# Patient Record
Sex: Male | Born: 1985 | Race: White | Hispanic: No | Smoking: Former smoker
Health system: Southern US, Community
[De-identification: ages and names within clinical notes are randomized; demographics above are authoritative.]

## PROBLEM LIST (undated history)

## (undated) DIAGNOSIS — F4310 Post-traumatic stress disorder, unspecified: Secondary | ICD-10-CM

## (undated) HISTORY — PX: KNEE SURGERY: SHX244

## (undated) MED ORDER — SERTRALINE 100 MG TAB
100 mg | ORAL_TABLET | Freq: Two times a day (BID) | ORAL | Status: DC
Start: ? — End: 2014-09-30

## (undated) MED ORDER — QUETIAPINE 100 MG TAB
100 mg | ORAL_TABLET | Freq: Every evening | ORAL | Status: DC | PRN
Start: ? — End: 2014-09-30

## (undated) MED ORDER — PRAZOSIN 5 MG CAP
5 mg | ORAL_CAPSULE | Freq: Every evening | ORAL | Status: DC
Start: ? — End: 2014-09-30

## (undated) MED ORDER — LORAZEPAM 2 MG TAB
2 mg | ORAL_TABLET | Freq: Three times a day (TID) | ORAL | Status: DC | PRN
Start: ? — End: 2014-09-30

## (undated) MED ORDER — IBUPROFEN 600 MG TAB
600 mg | ORAL_TABLET | Freq: Four times a day (QID) | ORAL | Status: DC | PRN
Start: ? — End: 2014-09-30

---
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
## 2007-03-06 IMAGING — CR DG KNEE COMPLETE 4+V*L*
4 series · 4 of 4 positions shown · non-contrast
Comparison: None.

CLINICAL DATA: Twisted knee ? pain and swelling. 
 LEFT KNEE ? 4 VIEW:

[view not recorded (1 of 4)]
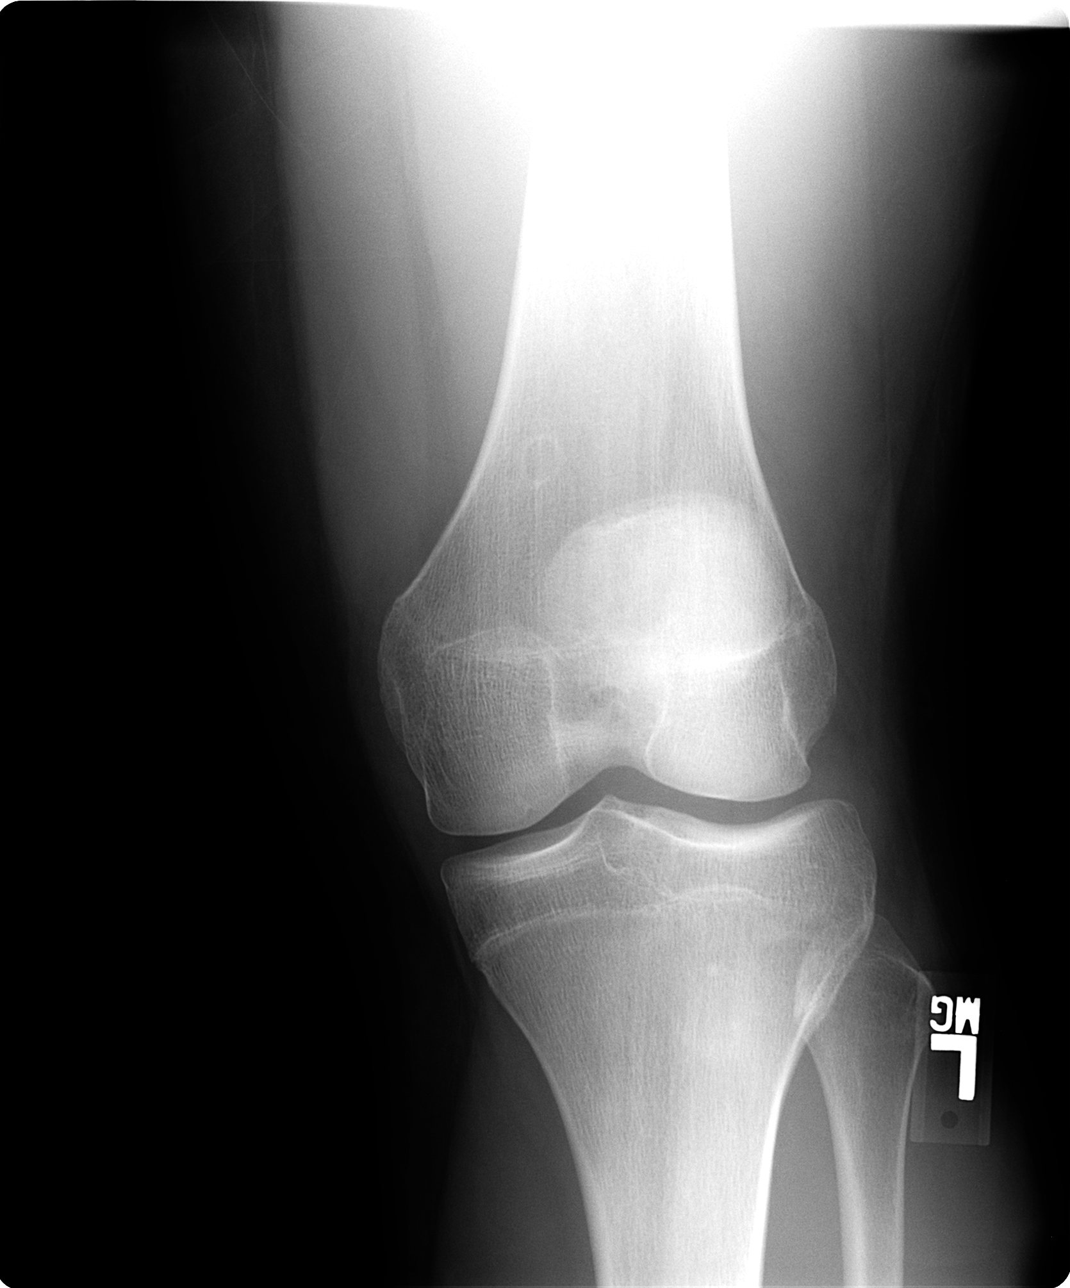

[view not recorded (2 of 4)]
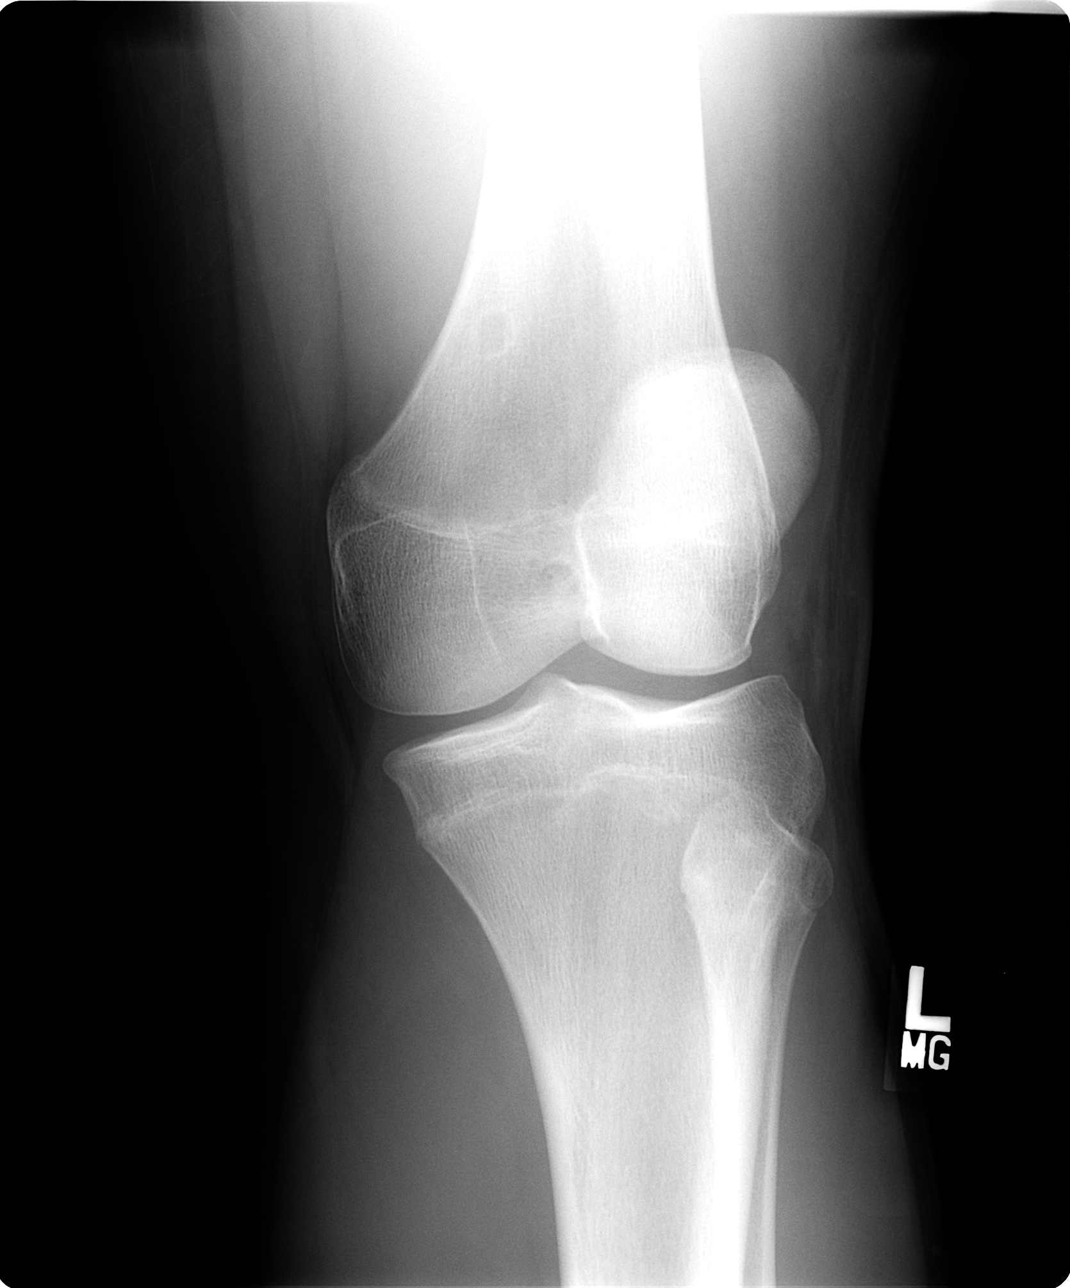

[view not recorded (3 of 4)]
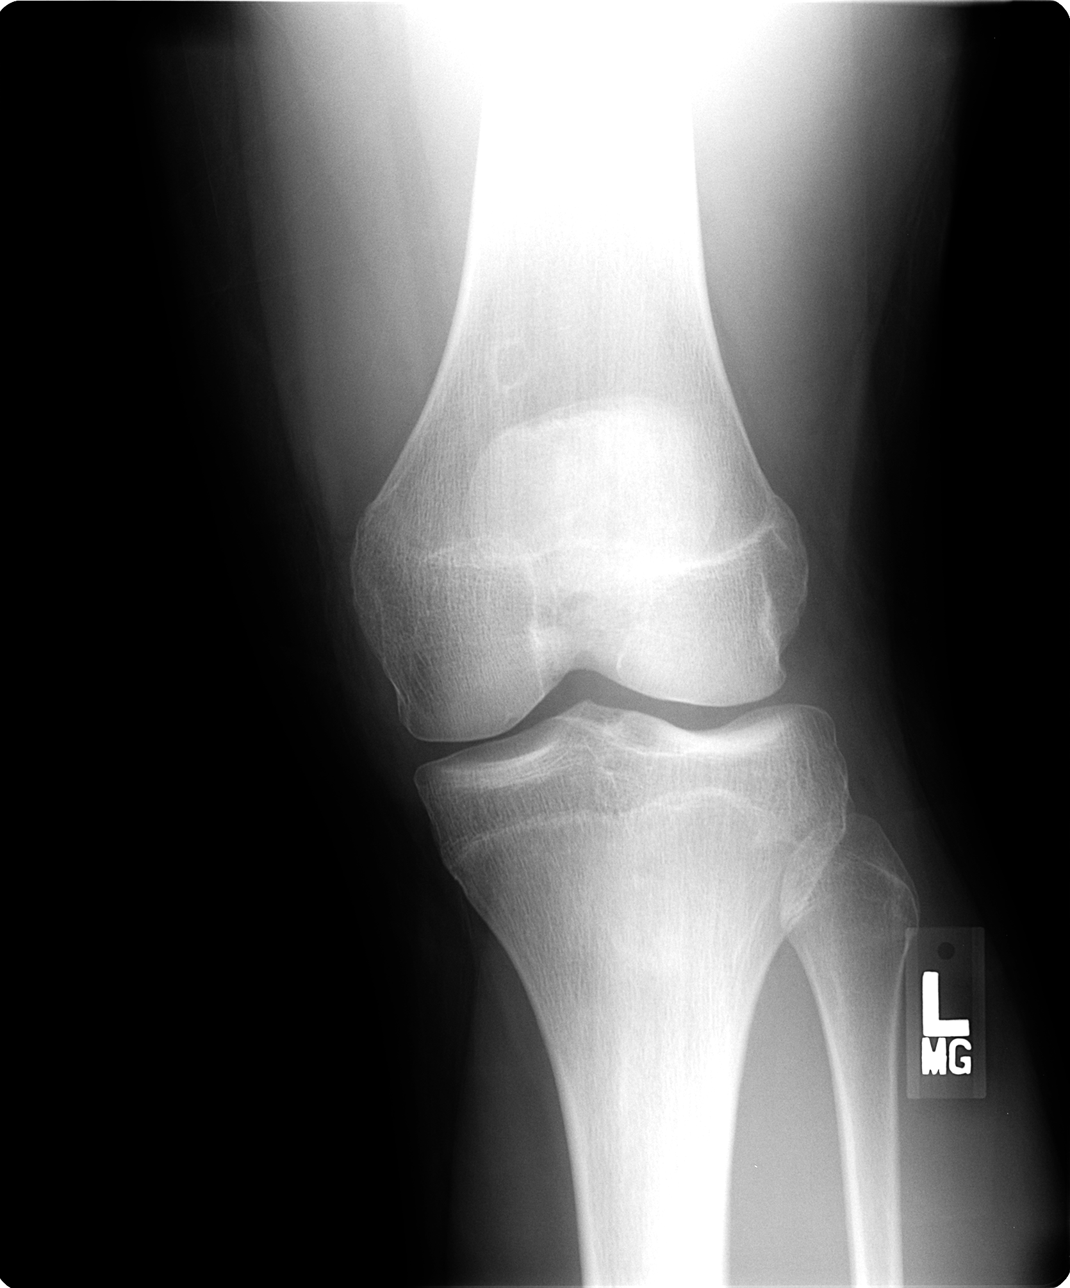

[view not recorded (4 of 4)]
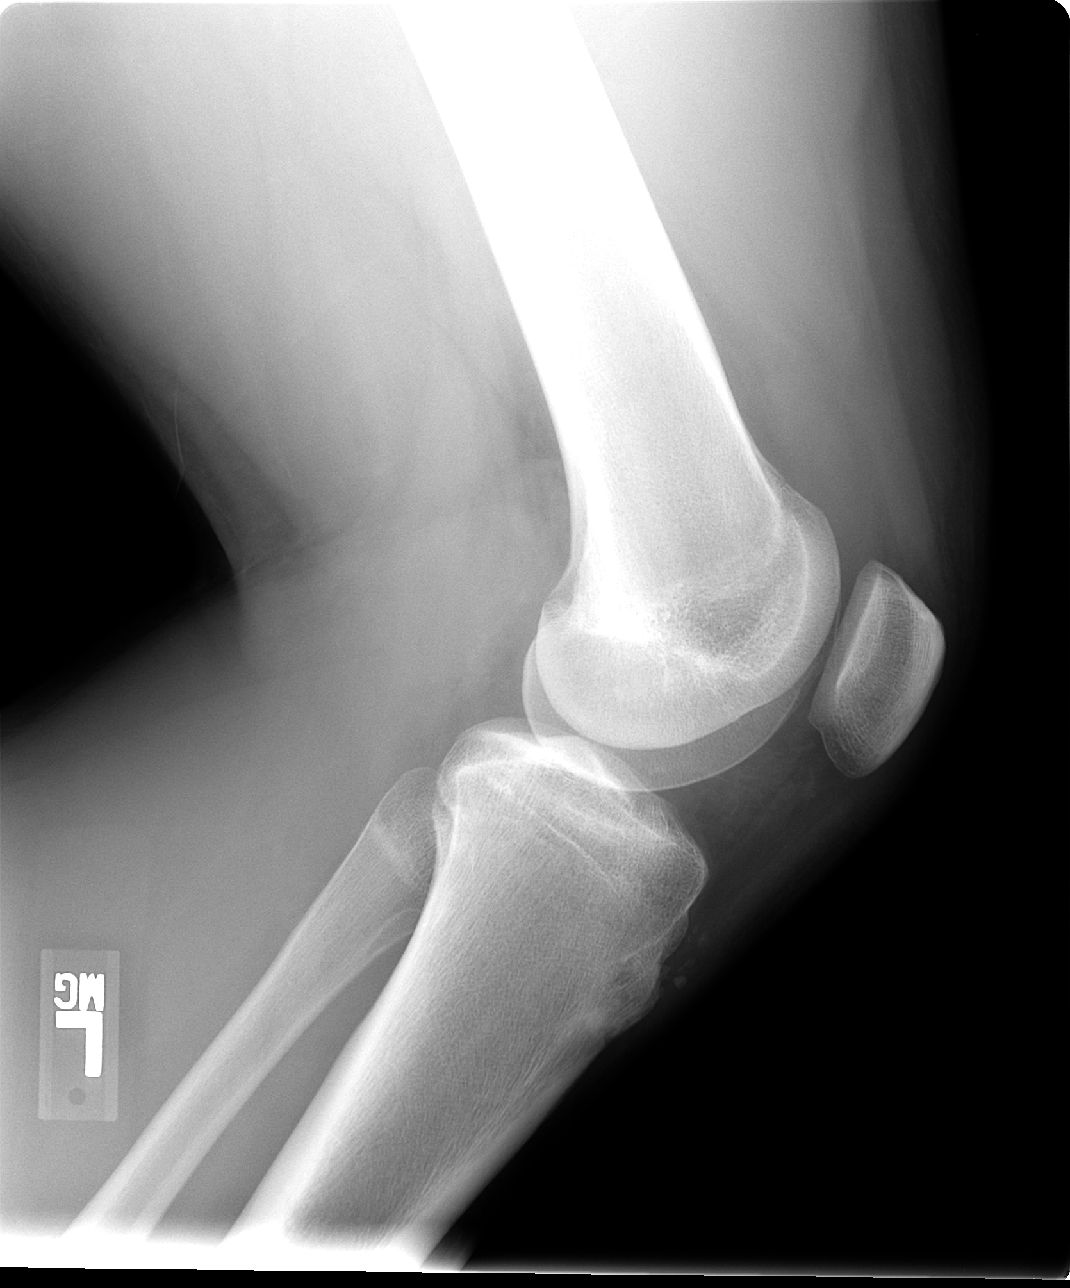

[4 of 4 positions shown; findings below may reference images not displayed]

FINDINGS: No acute abnormality of the bony structures.  There is a joint effusion noted on the lateral view.  There is a benign lesion of the medial aspect of the distal femur, probably representing a benign fibrous cortical defect.  On the lateral view one also sees some fragmentation of the upper tibial tuberosity, which is a variant of normal.
IMPRESSION: 1.  Probable benign fibrous cortical defect of the distal femur. 
 2.  Joint effusion. 
 3.  No acute fracture or dislocation.

## 2009-04-25 ENCOUNTER — Emergency Department (HOSPITAL_COMMUNITY): Admission: EM | Admit: 2009-04-25 | Discharge: 2009-04-25 | Payer: Self-pay | Admitting: Family Medicine

## 2009-06-13 ENCOUNTER — Emergency Department (HOSPITAL_COMMUNITY): Admission: EM | Admit: 2009-06-13 | Discharge: 2009-06-13 | Payer: Self-pay | Admitting: Emergency Medicine

## 2009-07-12 ENCOUNTER — Emergency Department (HOSPITAL_COMMUNITY): Admission: EM | Admit: 2009-07-12 | Discharge: 2009-07-12 | Payer: Self-pay | Admitting: Emergency Medicine

## 2009-08-23 ENCOUNTER — Emergency Department (HOSPITAL_COMMUNITY): Admission: EM | Admit: 2009-08-23 | Discharge: 2009-08-23 | Payer: Self-pay | Admitting: Emergency Medicine

## 2010-02-14 ENCOUNTER — Emergency Department (HOSPITAL_COMMUNITY): Admission: EM | Admit: 2010-02-14 | Discharge: 2010-02-14 | Payer: Self-pay | Admitting: Emergency Medicine

## 2010-06-19 ENCOUNTER — Emergency Department (HOSPITAL_COMMUNITY): Admission: EM | Admit: 2010-06-19 | Discharge: 2010-06-20 | Payer: Self-pay | Admitting: Emergency Medicine

## 2010-06-21 ENCOUNTER — Emergency Department (HOSPITAL_COMMUNITY): Admission: EM | Admit: 2010-06-21 | Discharge: 2010-06-21 | Payer: Self-pay | Admitting: Emergency Medicine

## 2010-10-26 LAB — RAPID URINE DRUG SCREEN, HOSP PERFORMED
Amphetamines: NOT DETECTED
Barbiturates: NOT DETECTED
Benzodiazepines: NOT DETECTED
Cocaine: NOT DETECTED
Opiates: NOT DETECTED
Tetrahydrocannabinol: NOT DETECTED

## 2010-10-26 LAB — DIFFERENTIAL
Basophils Absolute: 0.2 10*3/uL — ABNORMAL HIGH (ref 0.0–0.1)
Eosinophils Absolute: 0.1 10*3/uL (ref 0.0–0.7)
Neutro Abs: 6.3 10*3/uL (ref 1.7–7.7)
Neutrophils Relative %: 67 % (ref 43–77)

## 2010-10-26 LAB — CBC
HCT: 41.9 % (ref 39.0–52.0)
MCHC: 35 g/dL (ref 30.0–36.0)
MCV: 87.8 fL (ref 78.0–100.0)
Platelets: 274 10*3/uL (ref 150–400)
RDW: 12.5 % (ref 11.5–15.5)

## 2010-10-26 LAB — BASIC METABOLIC PANEL
BUN: 7 mg/dL (ref 6–23)
Chloride: 109 mEq/L (ref 96–112)
Creatinine, Ser: 1.02 mg/dL (ref 0.4–1.5)
GFR calc non Af Amer: >60 mL/min (ref >60)
Glucose, Bld: 119 mg/dL — ABNORMAL HIGH (ref 70–99)

## 2011-02-13 ENCOUNTER — Emergency Department (HOSPITAL_BASED_OUTPATIENT_CLINIC_OR_DEPARTMENT_OTHER)
Admission: EM | Admit: 2011-02-13 | Discharge: 2011-02-14 | Disposition: A | Discharge status: Home / Self Care | Payer: Non-veteran care | Attending: Emergency Medicine | Admitting: Emergency Medicine

## 2011-02-13 DIAGNOSIS — Y92009 Unspecified place in unspecified non-institutional (private) residence as the place of occurrence of the external cause: Secondary | ICD-10-CM | POA: Insufficient documentation

## 2011-02-13 DIAGNOSIS — IMO0002 Reserved for concepts with insufficient information to code with codable children: Secondary | ICD-10-CM | POA: Insufficient documentation

## 2011-02-13 DIAGNOSIS — K047 Periapical abscess without sinus: Secondary | ICD-10-CM | POA: Insufficient documentation

## 2011-02-13 DIAGNOSIS — X503XXA Overexertion from repetitive movements, initial encounter: Secondary | ICD-10-CM | POA: Insufficient documentation

## 2011-02-15 ENCOUNTER — Encounter: Payer: Self-pay | Admitting: Emergency Medicine

## 2011-02-15 ENCOUNTER — Emergency Department (HOSPITAL_BASED_OUTPATIENT_CLINIC_OR_DEPARTMENT_OTHER)
Admission: EM | Admit: 2011-02-15 | Discharge: 2011-02-15 | Disposition: A | Discharge status: Home / Self Care | Payer: Non-veteran care | Attending: Emergency Medicine | Admitting: Emergency Medicine

## 2011-02-15 DIAGNOSIS — T148XXA Other injury of unspecified body region, initial encounter: Secondary | ICD-10-CM | POA: Insufficient documentation

## 2011-02-15 DIAGNOSIS — S8990XA Unspecified injury of unspecified lower leg, initial encounter: Secondary | ICD-10-CM

## 2011-02-15 DIAGNOSIS — K089 Disorder of teeth and supporting structures, unspecified: Secondary | ICD-10-CM | POA: Insufficient documentation

## 2011-02-15 DIAGNOSIS — K0889 Other specified disorders of teeth and supporting structures: Secondary | ICD-10-CM

## 2011-02-15 DIAGNOSIS — X58XXXA Exposure to other specified factors, initial encounter: Secondary | ICD-10-CM | POA: Insufficient documentation

## 2011-02-15 HISTORY — DX: Post-traumatic stress disorder, unspecified: F43.10

## 2011-02-15 MED ORDER — PROMETHAZINE HCL 25 MG PO TABS: 25.0000 mg | ORAL_TABLET | Freq: Four times a day (QID) | ORAL | Status: DC | PRN

## 2011-02-15 MED ORDER — IBUPROFEN 600 MG PO TABS: 600.0000 mg | ORAL_TABLET | Freq: Three times a day (TID) | ORAL | Status: AC | PRN

## 2011-02-15 MED ORDER — HYDROCODONE-ACETAMINOPHEN 5-500 MG PO TABS: 1.0000 | ORAL_TABLET | Freq: Four times a day (QID) | ORAL | Status: AC | PRN

## 2011-02-15 NOTE — ED Provider Notes (Signed)
History     Chief Complaint  Patient presents with  . Knee Pain  . Dental Pain   Patient is a 25 y.o. male presenting with knee pain and tooth pain. The history is provided by the patient.  Knee Pain This is a recurrent problem. Episode onset: 3-4 days ago. The problem occurs constantly. The problem has been gradually improving. Pertinent negatives include no chest pain, no abdominal pain, no headaches and no shortness of breath. The symptoms are aggravated by walking. The symptoms are relieved by nothing. The treatment provided mild relief.  Dental PainPrimary symptoms do not include headaches, fever or shortness of breath.  pt indicates initial knee injury 2008. Says a few days ago he twisted knee, [facility] requests ref[facility] of pain meds, was in ed 7/6 with same. Symptoms sl improved. Also notes continued dental pain, no abrupt worseing. Was given abx rx and pain rx. No fever or chills.  No lower leg pain or swelling, pain is localized to knee. No numbness/weakness. No hip or ankle pain.   Past Medical History  Diagnosis Date  . PTSD (post-traumatic stress disorder)     Past Surgical History  Procedure Date  . Knee surgery     No family history on file.  History  Substance Use Topics  . Smoking status: Current Everyday Smoker  . Smokeless tobacco: Not on file  . Alcohol Use: Yes     occ      Review of Systems  Constitutional: Negative for fever.  HENT: Negative for neck pain.   Eyes: Negative for pain.  Respiratory: Negative for shortness of breath.   Cardiovascular: Negative for chest pain.  Gastrointestinal: Negative for abdominal pain.  Musculoskeletal: Negative for back pain.  Skin: Negative for rash.  Neurological: Negative for headaches.  Hematological: Does not bruise/bleed easily.  Psychiatric/Behavioral: Negative for behavioral problems.    Physical Exam  BP 124/67  Pulse 112  Resp 18  SpO2 100%  Physical Exam  Nursing note  and vitals reviewed. Constitutional: He is oriented to person, place, and time. He appears well-developed and well-nourished. No distress.  HENT:  Head: Atraumatic.       Right upper molar tenderness. No gum swelling or obvious abscess noted.  No trismus.   Eyes: Pupils are equal, round, and reactive to light.  Neck: Neck supple. No tracheal deviation present.  Cardiovascular: Normal rate.   Pulmonary/Chest: Effort normal. No accessory muscle usage. No respiratory distress.  Abdominal: He exhibits no distension.  Musculoskeletal: Normal range of motion.       Left knee good rom. No gross ligament lax or instability noted. No effusion. No erythema or skin changes. Good rom at hip and ankle without pain. Distal pulses palp.   Neurological: He is alert and oriented to person, place, and time.       Motor intact. Steady gait.   Skin: Skin is warm and dry.  Psychiatric: He has a normal mood and affect.    ED Course  Procedures  MDM Discussed as second visit and persistent knee pain, plan for xray. Pt declines xray, states he [facility] On recheck hr 92, rr 16.       Suzi Roots, MD 02/15/11 2041

## 2011-02-23 ENCOUNTER — Encounter (HOSPITAL_BASED_OUTPATIENT_CLINIC_OR_DEPARTMENT_OTHER): Payer: Self-pay | Admitting: *Deleted

## 2011-02-23 ENCOUNTER — Emergency Department (HOSPITAL_BASED_OUTPATIENT_CLINIC_OR_DEPARTMENT_OTHER)
Admission: EM | Admit: 2011-02-23 | Discharge: 2011-02-23 | Disposition: A | Discharge status: Home / Self Care | Payer: Non-veteran care | Attending: Emergency Medicine | Admitting: Emergency Medicine

## 2011-02-23 DIAGNOSIS — M25569 Pain in unspecified knee: Secondary | ICD-10-CM | POA: Insufficient documentation

## 2011-02-23 DIAGNOSIS — F17200 Nicotine dependence, unspecified, uncomplicated: Secondary | ICD-10-CM | POA: Insufficient documentation

## 2011-02-23 DIAGNOSIS — G8929 Other chronic pain: Secondary | ICD-10-CM | POA: Insufficient documentation

## 2011-02-23 MED ORDER — IBUPROFEN 800 MG PO TABS
ORAL_TABLET | ORAL | Status: AC
  Filled 2011-02-23: qty 1

## 2011-02-23 MED ORDER — IBUPROFEN 800 MG PO TABS
800.0000 mg | ORAL_TABLET | Freq: Once | ORAL | Status: AC
Start: 2011-02-23 — End: 2011-02-23
  Administered 2011-02-23: 800 mg via ORAL

## 2011-02-23 NOTE — ED Notes (Signed)
Pt states he is scheduled for surgery on knee in 1.5 weeks at [facility]. Pt states he was sent here by [facility] for pain medication until surgery.

## 2011-02-23 NOTE — ED Notes (Signed)
Pt states seen here 1.5 weeks ago for same, left knee pain, [facility],

## 2011-02-23 NOTE — ED Provider Notes (Signed)
History     Chief Complaint  Patient presents with  . Knee Pain   Patient is a 25 y.o. male presenting with knee pain. The history is provided by the patient.  Knee Pain This is a chronic problem. The problem occurs constantly. The problem has not changed since onset.The symptoms are aggravated by twisting and standing.  pt reports injury to left knee in 2008 during [employer] service.  He reports he twisted the knee recently.  He is scheduled for surgery at [facility] in 1.5 weeks, but he reports he was told to come here for pain meds.    Past Medical History  Diagnosis Date  . PTSD (post-traumatic stress disorder)   . PTSD (post-traumatic stress disorder)     Past Surgical History  Procedure Date  . Knee surgery     History reviewed. No pertinent family history.  History  Substance Use Topics  . Smoking status: Current Everyday Smoker  . Smokeless tobacco: Not on file  . Alcohol Use: Yes     occ      Review of Systems  Constitutional: Negative for fever.  Neurological: Negative for weakness.    Physical Exam  BP 120/83  Pulse 107  Temp(Src) 99.6 F (37.6 C) (Oral)  Resp 16  Wt 260 lb (117.935 kg)  SpO2 100%  Physical Exam  CONSTITUTIONAL: Well developed/well nourished HEAD AND FACE: Normocephalic/atraumatic EYES: EOMI/PERRL ENMT: Mucous membranes moist NECK: supple no meningeal signs  CV: S1/S2 noted, no murmurs/rubs/gallops noted LUNGS: Lungs are clear to auscultation bilaterally, no apparent distress ABDOMEN: soft, nontender, no rebound or guarding  NEURO: Pt is awake/alert, moves all extremitiesx4 EXTREMITIES: pulses normal, full ROM, no erythema/warmth/effusion noted to left knee.  Tenderness to left patella  No calf tenderness to left LE  SKIN: warm, color normal PSYCH: no abnormalities of mood noted  ED Course  Procedures  MDM Previous records reviewed and considered I [facility] He requests  crutches and ACE wrap      Joya Gaskins, MD 02/23/11 2319

## 2011-02-23 NOTE — ED Notes (Signed)
Pt refused crutches.

## 2011-02-24 ENCOUNTER — Emergency Department (HOSPITAL_COMMUNITY)
Admission: EM | Admit: 2011-02-24 | Discharge: 2011-02-24 | Disposition: A | Discharge status: Home / Self Care | Payer: Non-veteran care | Attending: Emergency Medicine | Admitting: Emergency Medicine

## 2011-02-24 DIAGNOSIS — Z79899 Other long term (current) drug therapy: Secondary | ICD-10-CM | POA: Insufficient documentation

## 2011-02-24 DIAGNOSIS — M25569 Pain in unspecified knee: Secondary | ICD-10-CM | POA: Insufficient documentation

## 2011-02-24 DIAGNOSIS — M25469 Effusion, unspecified knee: Secondary | ICD-10-CM | POA: Insufficient documentation

## 2011-02-24 DIAGNOSIS — Z9889 Other specified postprocedural states: Secondary | ICD-10-CM | POA: Insufficient documentation

## 2011-10-30 ENCOUNTER — Encounter (HOSPITAL_COMMUNITY): Payer: Self-pay | Admitting: *Deleted

## 2011-10-30 ENCOUNTER — Emergency Department (HOSPITAL_COMMUNITY)
Admission: EM | Admit: 2011-10-30 | Discharge: 2011-10-30 | Disposition: A | Discharge status: Home / Self Care | Payer: Non-veteran care | Attending: Emergency Medicine | Admitting: Emergency Medicine

## 2011-10-30 DIAGNOSIS — L03211 Cellulitis of face: Secondary | ICD-10-CM | POA: Insufficient documentation

## 2011-10-30 DIAGNOSIS — L0201 Cutaneous abscess of face: Secondary | ICD-10-CM | POA: Insufficient documentation

## 2011-10-30 DIAGNOSIS — F17200 Nicotine dependence, unspecified, uncomplicated: Secondary | ICD-10-CM | POA: Insufficient documentation

## 2011-10-30 DIAGNOSIS — Z91013 Allergy to seafood: Secondary | ICD-10-CM | POA: Insufficient documentation

## 2011-10-30 DIAGNOSIS — F4310 Post-traumatic stress disorder, unspecified: Secondary | ICD-10-CM | POA: Insufficient documentation

## 2011-10-30 DIAGNOSIS — L0390 Cellulitis, unspecified: Secondary | ICD-10-CM

## 2011-10-30 MED ORDER — DOXYCYCLINE HYCLATE 100 MG PO CAPS: 100.0000 mg | ORAL_CAPSULE | Freq: Two times a day (BID) | ORAL | Status: AC

## 2011-10-30 MED ORDER — CEPHALEXIN 500 MG PO CAPS: 500.0000 mg | ORAL_CAPSULE | Freq: Four times a day (QID) | ORAL | Status: AC

## 2011-10-30 NOTE — ED Provider Notes (Signed)
History     CSN: [id number]  Arrival date & time 10/30/11  0008   First MD Initiated Contact with Patient 10/30/11 [phone]      Chief Complaint  Patient presents with  . Jaw Pain    (Consider location/radiation/quality/duration/timing/severity/associated sxs/prior treatment) HPI Comments: Patient presents with some facial pain and swelling present on the chin. States he first noticed a "pimple" yesterday which she tried to "pop." States she's had increasing pain and swelling since this. He has some associated erythema. Denies fever, dental pain, sore throat, headache. There is no drainage.  The history is provided by the patient. No language interpreter was used.    Past Medical History  Diagnosis Date  . PTSD (post-traumatic stress disorder)   . PTSD (post-traumatic stress disorder)     Past Surgical History  Procedure Date  . Knee surgery     No family history on file.  History  Substance Use Topics  . Smoking status: Current Everyday Smoker  . Smokeless tobacco: Not on file  . Alcohol Use: Yes     occ      Review of Systems  Constitutional: Negative for fever, activity change, appetite change and fatigue.  HENT: Positive for facial swelling. Negative for neck stiffness.   Respiratory: Negative for shortness of breath.   Cardiovascular: Negative for palpitations.  Genitourinary: Negative for urgency, frequency and flank pain.  Musculoskeletal: Negative for myalgias and arthralgias.  Neurological: Negative for dizziness, light-headedness and numbness.  All other systems reviewed and are negative.    Allergies  Shellfish allergy and Vicodin  Home Medications   Current Outpatient Rx  Name Route Sig Dispense Refill  . SERTRALINE HCL 50 MG PO TABS Oral Take 50 mg by mouth daily.    . CEPHALEXIN 500 MG PO CAPS Oral Take 1 capsule (500 mg total) by mouth 4 (four) times daily. 28 capsule 0  . DOXYCYCLINE HYCLATE 100 MG PO CAPS Oral Take 1 capsule (100 mg total)  by mouth 2 (two) times daily. 14 capsule 0    BP 155/89  Pulse 87  Temp(Src) 99 F (37.2 C) (Oral)  Resp 17  SpO2 99%  Physical Exam  Nursing note and vitals reviewed. Constitutional: He is oriented to person, place, and time. He appears well-developed and well-nourished. No distress.  HENT:  Head: Normocephalic and atraumatic.  Mouth/Throat: Oropharynx is clear and moist. No oropharyngeal exudate.       Mild area of induration to the right aspect of the chin. There is no active drainage. There is some associated erythema. This is located near an acne papule. There is no dental origin of the swelling.  Eyes: Conjunctivae and EOM are normal. Pupils are equal, round, and reactive to light.  Neck: Normal range of motion. Neck supple.  Cardiovascular: Normal rate, regular rhythm, normal heart sounds and intact distal pulses.  Exam reveals no gallop and no friction rub.   No murmur heard. Pulmonary/Chest: Effort normal and breath sounds normal. No respiratory distress.  Abdominal: Soft. Bowel sounds are normal. There is no tenderness.  Musculoskeletal: Normal range of motion. He exhibits no tenderness.  Neurological: He is alert and oriented to person, place, and time. No cranial nerve deficit.  Skin: Skin is warm and dry.    ED Course  Procedures (including critical care time)  Labs Reviewed - No data to display No results found.   1. Cellulitis       MDM  Mild facial cellulitis associated with a acne papule.  Instructed to apply warm compresses. There is no abscess to drain. Will be placed on Doxy and Keflex. Instructed to followup with his primary care physician.        Dayton Bailiff, MD 10/30/11 [phone]

## 2011-10-30 NOTE — ED Notes (Signed)
Pt states that he started to have what he believed to be a pimple yesterday and attempted, in vain, to pop said pimple.  Pt awoke this morning complaining of right jaw pain and swelling.  There is noted to be serous fluid coming from this bump.

## 2011-10-30 NOTE — Discharge Instructions (Signed)

## 2011-10-30 NOTE — ED Notes (Signed)
Bed:WTR5<BR> Expected date:<BR> Expected time:<BR> Means of arrival:<BR> Comments:<BR>

## 2013-05-09 NOTE — ED Notes (Signed)
Chest xray complete.

## 2013-05-09 NOTE — ED Notes (Signed)
Labs drawn.

## 2013-05-09 NOTE — ED Provider Notes (Signed)
Patient is a 27 y.o. male presenting with chest pain. The history is provided by the patient.   Chest Pain (Angina)   This is a new problem. The current episode started yesterday. Progression since onset: intermittent. The pain is associated with normal activity. Pertinent negatives include no back pain, no fever and no palpitations.    Patient is c/o pain in the left side of chest and left lower ribs with radiation to the left shoulder and arm.  This has been happening on and off, lasting about 15 minutes at a time.  No apparent provoking activities or positions.  No aggravating or relieving factors.  No SOB or cough.    Patient states he ran out of all meds about 1 month ago - used to be on meds due to PTSD, nightmares, anxiety.  He is unsure of names.  States he felt like he was going to have an anxiety attack when coming into the ED.  Did not have that feeling last night or earlier.      Past Medical History   Diagnosis Date   ??? PTSD (post-traumatic stress disorder)    ??? 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 No pertinent past surgical history.      History reviewed. No pertinent family history.     History     Social History   ??? Marital Status: MARRIED     Spouse Name: N/A     Number of Children: N/A   ??? Years of Education: N/A     Occupational History   ??? Not on file.     Social History Main Topics   ??? Smoking status: Never Smoker    ??? Smokeless tobacco: Not on file   ??? Alcohol Use: No   ??? Drug Use: No   ??? Sexually Active: Not on file     Other Topics Concern   ??? Not on file     Social History Narrative   ??? No narrative on file                  ALLERGIES: Trazodone      Review of Systems   Constitutional: Negative for fever and chills.   HENT: Negative for congestion and sore throat.    Respiratory: Negative.    Cardiovascular: Positive for chest pain. Negative for palpitations and leg swelling.   Gastrointestinal: Negative.    Musculoskeletal: Negative for back  pain.   All other systems reviewed and are negative.        Filed Vitals:    05/09/13 1937 05/09/13 2007 05/09/13 2059   BP: 148/94  153/85   Pulse: 126 120 95   Temp: 98.6 ??F (37 ??C) 100.4 ??F (38 ??C)    Resp: 20 20 20    Height: 6' (1.829 m)     Weight: 113.399 kg (250 lb)     SpO2: 98%              Physical Exam   Nursing note and vitals reviewed.  Constitutional: He is oriented to person, place, and time. He appears well-developed and well-nourished.   HENT:   Head: Normocephalic and atraumatic.   Mouth/Throat: Oropharynx is clear and moist.   Eyes: Conjunctivae and EOM are normal. Pupils are equal, round, and reactive to light.   Neck: Normal range of motion. Neck supple.   Cardiovascular: Regular rhythm, normal heart sounds and normal pulses.  Tachycardia present.  Exam reveals no gallop and no friction rub.    No murmur heard.  Pulmonary/Chest: Effort normal and breath sounds normal. No respiratory distress. He has no wheezes. He has no rales.   Abdominal: Soft. He exhibits no distension and no mass. There is no tenderness. There is no rebound and no guarding.   Musculoskeletal: Normal range of motion. He exhibits no edema and no tenderness.   Lymphadenopathy:     He has no cervical adenopathy.   Neurological: He is alert and oriented to person, place, and time. He has normal strength. No cranial nerve deficit. Coordination normal.   Skin: Skin is warm and dry. No rash noted.   Psychiatric: He has a normal mood and affect. His behavior is normal.        MDM    Procedures      ED EKG interpretation:  Rhythm: sinus tachycardia; and regular . Rate (approx.): 113; Axis: normal; P wave: normal; QRS interval: normal ; ST/T wave: normal; ; Other findings: left ventricular hypertrophy by voltage. This EKG was interpreted by Leitha Schuller, MD,ED Provider.    CXR reviewed by me - shows no acute cardiopulmonary disease, specifically no infiltrate, effusion, or pneumothorax.    Labs reviewed - results are all unremarkable  except total CK.  Troponin and d-dimer are negative.    Patient is a smoker - stopped about 3 months ago.  He reports family history of heart disease as well.  Continues to have intermittent chest pains in ED after PO Ativan.  Advise admission for cardiac evaluation.  Case d/w hospitalist Dr. Gilman Schmidt.      <EMERGENCY DEPARTMENT CASE SUMMARY>    Plan: Admit for tele monitoring and cardiac w/u.    ED Course: as noted above    Final Impression/Diagnosis: Chest pain    Patient condition at time of disposition: stable      I have reviewed the following home medications:    Prior to Admission medications    Not on File         Leitha Schuller, MD          The following are your lab and/or imaging results:  Recent Results (from the past 24 hour(s))   D DIMER    Collection Time     05/09/13  8:23 PM       Result Value Range    D-dimer 218  0 - 400 NG/ML   CK    Collection Time     05/09/13  8:23 PM       Result Value Range    CK 431 (*) 39 - 308 U/L   METABOLIC PANEL, COMPREHENSIVE    Collection Time     05/09/13  8:23 PM       Result Value Range    Sodium 141  136 - 145 mmol/L    Potassium 4.0  3.5 - 5.1 mmol/L    Chloride 104  98 - 107 mmol/L    CO2 29  21 - 32 mmol/L    Anion gap 9  4 - 12 mmol/L    Glucose 89  74 - 106 mg/dL    BUN 11  7 - 18 mg/dL    Creatinine 1.2  0.8 - 1.3 mg/dL    GFR est AA >45  >40 ml/min/1.71m2    GFR est non-AA >60  >60 ml/min/1.78m2    Calcium 9.4  8.5 - 10.1 mg/dL    Bilirubin, total 0.4  0.2 -  1.0 mg/dL    ALT 46  12 - 78 U/L    AST 25  15 - 37 U/L    Alk. phosphatase 72  46 - 116 U/L    Protein, total 8.1  6.4 - 8.2 g/dL    Albumin 4.4  3.4 - 5.0 g/dL    Globulin 3.7  2.8 - 3.9 g/dL    A-G Ratio 1.2  1.0 - 1.5     CBC WITH AUTOMATED DIFF    Collection Time     05/09/13  8:23 PM       Result Value Range    WBC 7.9  4.8 - 11.0 K/uL    RBC 4.88  4.70 - 6.10 M/uL    HGB 14.5  13.5 - 17.5 g/dL    HCT 16.1 (*) 09.6 - 53.0 %    MCV 83.8  80.0 - 100.0 FL    MCH 29.7  27.0 - 31.0 PG    MCHC 35.5  31.0 -  37.0 g/dL    RDW 04.5  40.9 - 81.1 %    PLATELET 288  130 - 400 K/uL    MPV 9.7 (*) 6.5 - 9.5 FL    NEUTROPHILS 67  42.2 - 75.2 %    LYMPHOCYTES 24  20.5 - 51.1 %    MONOCYTES 7  0.0 - 12.0 %    EOSINOPHILS 1  0.0 - 7.0 %    BASOPHILS 1  0.0 - 2.5 %    ABS. NEUTROPHILS 5.3  2.0 - 8.1 K/UL    ABS. LYMPHOCYTES 1.9  1.3 - 3.4 K/UL    ABS. MONOCYTES 0.6  0.1 - 1.0 K/UL    ABS. EOSINOPHILS 0.1  0.0 - 0.2 K/UL    ABS. BASOPHILS 0.0  0.0 - 0.1 K/UL    DF AUTOMATED      IMMATURE GRANULOCYTES 0.1  0 - 2 %    ABS. IMM. GRANS. 0.0  0 K/UL   TROPONIN I    Collection Time     05/09/13  8:23 PM       Result Value Range    Troponin-I, Qt. <0.04  0.00 - 0.09 NG/ML   MAGNESIUM    Collection Time     05/09/13  8:23 PM       Result Value Range    Magnesium 1.7 (*) 1.8 - 2.4 mg/dL   CKMB,MASS W/ REL IDX    Collection Time     05/09/13  8:23 PM       Result Value Range    CK mass MB <0.5  0.0 - 5.0 ng/ml    CK mass MB Relative Index %MMB NOT CALCULATED,MMB<=5.0  0.0 - 4.0 %     Xr Chest [facility]    05/09/2013   Portable Chest Radiograph    performed on 05/09/2013 8:03 PM    for patient      male of 27 years  CLINICAL INFORMATION:   Chest pain   TECHNIQUE:  Frontal portable view of the chest was obtained.  FINDINGS:  Prior study: None  The lungs are clear.  No pleural abnormality is seen.  Bony thorax is intact.  The heart size is normal.  The mediastinum appears intact.      05/09/2013   IMPRESSION:   No evidence of active chest disease.  THIS DOCUMENT HAS BEEN ELECTRONICALLY SIGNED BY Viviann Spare LEFFLER MD

## 2013-05-09 NOTE — ED Notes (Signed)
Call out to Dr Gilman Schmidt regarding STAT ultrasound order as [place] tech not in building and has to be called in.

## 2013-05-09 NOTE — ED Notes (Signed)
Report received from offgoing [employer]. Pending admission orders.

## 2013-05-09 NOTE — ED Notes (Signed)
MD Pius at bedside for eval.

## 2013-05-09 NOTE — ED Notes (Signed)
Pt also feeling like he is going to have panic attack emotional support provided encourage deep breathing

## 2013-05-09 NOTE — ED Notes (Signed)
Received call back from Dr Gilman Schmidt regarding [place]. Discussed that tech not in building and has to be called in. Per Dr Gilman Schmidt, [place] can be done tomorrow. Asked Dr Gilman Schmidt to change order.

## 2013-05-09 NOTE — ED Notes (Signed)
Report called to Ginny RN using SBAR.

## 2013-05-09 NOTE — ED Notes (Addendum)
C/o chest pains radiates to left shoulder and down left arm started yesterday on and off gets worse each time with nausea/ been out of his medicine for month

## 2013-05-10 LAB — D-DIMER, QUANTITATIVE: D DIMER,DDMR: 218 NG/ML (ref 0–400)

## 2013-05-10 LAB — METABOLIC PANEL, COMPREHENSIVE
A-G Ratio: 1.2 (ref 1.0–1.5)
ALT (SGPT): 46 U/L (ref 12–78)
AST (SGOT): 25 U/L (ref 15–37)
Albumin: 4.4 g/dL (ref 3.4–5.0)
Alk. phosphatase: 72 U/L (ref 46–116)
Anion gap: 9 mmol/L (ref 4–12)
BUN: 11 mg/dL (ref 7–18)
Bilirubin, total: 0.4 mg/dL (ref 0.2–1.0)
CO2: 29 mmol/L (ref 21–32)
Calcium: 9.4 mg/dL (ref 8.5–10.1)
Chloride: 104 mmol/L (ref 98–107)
Creatinine: 1.2 mg/dL (ref 0.8–1.3)
GFR est AA: >60 mL/min/{1.73_m2} (ref >60)
GFR est non-AA: >60 mL/min/{1.73_m2} (ref >60)
Globulin: 3.7 g/dL (ref 2.8–3.9)
Glucose: 89 mg/dL (ref 74–106)
Potassium: 4 mmol/L (ref 3.5–5.1)
Protein, total: 8.1 g/dL (ref 6.4–8.2)
Sodium: 141 mmol/L (ref 136–145)

## 2013-05-10 LAB — EKG, 12 LEAD, INITIAL
Atrial Rate: 113 {beats}/min
Atrial Rate: 73 {beats}/min
Calculated P Axis: 42 degrees
Calculated P Axis: 59 degrees
Calculated R Axis: -6 degrees
Calculated R Axis: 3 degrees
Calculated T Axis: 2 degrees
Calculated T Axis: 5 degrees
Diagnosis: NORMAL
P-R Interval: 142 ms
P-R Interval: 144 ms
Q-T Interval: 334 ms
Q-T Interval: 414 ms
QRS Duration: 116 ms
QRS Duration: 98 ms
QTC Calculation (Bezet): 456 ms
QTC Calculation (Bezet): 458 ms
Ventricular Rate: 113 {beats}/min
Ventricular Rate: 73 {beats}/min

## 2013-05-10 LAB — CBC WITH AUTOMATED DIFF
ABS. BASOPHILS: 0 10*3/uL (ref 0.0–0.1)
ABS. EOSINOPHILS: 0.1 10*3/uL (ref 0.0–0.2)
ABS. IMM. GRANS.: 0 10*3/uL
ABS. LYMPHOCYTES: 1.9 10*3/uL (ref 1.3–3.4)
ABS. MONOCYTES: 0.6 10*3/uL (ref 0.1–1.0)
ABS. NEUTROPHILS: 5.3 10*3/uL (ref 2.0–8.1)
BASOPHILS: 1 % (ref 0.0–2.5)
EOSINOPHILS: 1 % (ref 0.0–7.0)
HCT: 40.9 % — ABNORMAL LOW (ref 41.0–53.0)
HGB: 14.5 g/dL (ref 13.5–17.5)
IMMATURE GRANULOCYTES: 0.1 % (ref 0–2)
LYMPHOCYTES: 24 % (ref 20.5–51.1)
MCH: 29.7 PG (ref 27.0–31.0)
MCHC: 35.5 g/dL (ref 31.0–37.0)
MCV: 83.8 FL (ref 80.0–100.0)
MONOCYTES: 7 % (ref 0.0–12.0)
MPV: 9.7 FL — ABNORMAL HIGH (ref 6.5–9.5)
NEUTROPHILS: 67 % (ref 42.2–75.2)
PLATELET: 288 10*3/uL (ref 130–400)
RBC: 4.88 M/uL (ref 4.70–6.10)
RDW: 12.4 % (ref 11.5–14.0)
WBC: 7.9 10*3/uL (ref 4.8–11.0)

## 2013-05-10 LAB — LIPASE: Lipase: 139 U/L (ref 73–393)

## 2013-05-10 LAB — LACTIC ACID: Lactic acid: 0.8 MMOL/L (ref 0.4–2.0)

## 2013-05-10 LAB — SED RATE, AUTOMATED: Sed rate, automated: 10 mm/hr (ref 0–20)

## 2013-05-10 LAB — MAGNESIUM: Magnesium: 1.7 mg/dL — ABNORMAL LOW (ref 1.8–2.4)

## 2013-05-10 LAB — D DIMER: D-dimer: 218 NG/ML (ref 0–400)

## 2013-05-10 LAB — TROPONIN I
Troponin-I, Qt.: <0.04 NG/ML (ref 0.00–0.09)
Troponin-I, Qt.: <0.04 NG/ML (ref 0.00–0.09)
Troponin-I, Qt.: <0.04 NG/ML (ref 0.00–0.09)

## 2013-05-10 LAB — CKMB,MASS W/ REL IDX: CK mass MB: <0.5 ng/ml (ref 0.0–5.0)

## 2013-05-10 LAB — CK: CK: 431 U/L — ABNORMAL HIGH (ref 39–308)

## 2013-05-10 MED ADMIN — acetaminophen (TYLENOL) tablet 650 mg: ORAL | NDC 50580050130

## 2013-05-10 MED ADMIN — acetaminophen (TYLENOL) 325 mg tablet: @ 04:00:00 | NDC 50580050130

## 2013-05-10 MED ADMIN — metoprolol (LOPRESSOR) tablet 25 mg: ORAL | @ 02:00:00 | NDC 62584026511

## 2013-05-10 MED ADMIN — quetiapine (SEROQUEL) tablet 100 mg: ORAL | @ 06:00:00 | NDC 00904627961

## 2013-05-10 MED ADMIN — sertraline (ZOLOFT) tablet 200 mg: ORAL | @ 14:00:00 | NDC 68084018211

## 2013-05-10 MED ADMIN — aspirin (ASPIRIN) tablet 325 mg: ORAL | @ 02:00:00 | NDC 66553000101

## 2013-05-10 MED ADMIN — acetaminophen (TYLENOL) tablet 650 mg: ORAL | @ 14:00:00 | NDC 50580050130

## 2013-05-10 MED ADMIN — magnesium oxide (MAG-OX) tablet 800 mg: ORAL | @ 17:00:00 | NDC 63739035410

## 2013-05-10 MED ADMIN — lorazepam (ATIVAN) 1 mg tablet: ORAL | NDC 63739050010

## 2013-05-10 MED ADMIN — lorazepam (ATIVAN) tablet 1 mg: ORAL | @ 10:00:00 | NDC 63739050010

## 2013-05-10 NOTE — Discharge Summary (Signed)
[facility]  DISCHARGE SUMMARY                                            Name: Richard Watts, Richard Watts                                            MR#: [id number]                                            Account #: [id number]                                            Date of Adm: 05/09/2013        Facility  DocId [id number]  ChartScript-[id number]        HOSPITAL COURSE: Mr. Richard Watts is a 27 year old male patient  who comes to the emergency room after having 2 days of chest  pain. He said that it came suddenly, it was on the side, he shows  left chest, and then he even shows axillary area. It was while at  rest, and he says it was radiating to the ribs. To the admitting  physician, he said it was radiating down the left arm, but also  he was saying that most of the time, when he takes a deep breath,  it will be somewhat uncomfortable. He was also having low-grade  fever of 100.4 in the emergency room, but he denies any cold  symptoms. This chest pain was on and off for these 2 days. He is  a gentleman with PTSD and he is off all of his medications for 2  days. He had a D-dimer within reference range. He  had a chest x-ray done twice, one was at bedside in the emergency  room and today, PA and lateral, which were normal. His CBC was  within reference range, so it leads to the conclusion that the  patient most likely has viral pleurisy, and he will benefit from  ibuprofen. He said that his mother, father and uncle were  affected by heart disease. That is from his family history.  Otherwise, the patient was restarted on part of his posttraumatic  stress disorder medication, and he requests that we give him some  short-term supply before he sees his new [place] physician. He does  not know the name, but he is going to organize a visit to the [place]  clinic today, 05/10/2013.    DISCHARGE MEDICATIONS: As above. Also he is being given:    1. Seroquel 100 mg p.o. at bedtime  p.r.n.  2. Ativan 2 mg q.8 hours as needed for anxiety.  3. Sertraline 100 mg p.o. twice daily.  4. Minipress 5 mg bedtime p.r.n. for nightmares.    Otherwise, his troponins were x3 negative. His EKG showed normal  sinus rhythm with some repolarization, and he had incomplete  right bundle branch block when he presented to the emergency  room, now there is none.          Marietta Sikkema  MD    AV:[id number]  D:  05/10/2013   11:50  T:  05/10/2013   12:14  Job #:  [id number]                wm    CS Doc#:  [id number]                                    Page 1 of 2

## 2013-05-10 NOTE — H&P (Signed)
[facility]  HISTORY   PHYSICAL                                            Name: Josedaniel, Haye                                            MR#: [id number]                                            Account #: [id number]                                            Date of Adm: 05/09/2013        Facility  DocId [id number]  ChartScript-[id number]        CHIEF COMPLAINT: The patient is a 27 year old with a history of  chest pain and fever.    HISTORY OF PRESENT ILLNESS: The patient is a 27 year old with a  family history of heart disease with both mother, father, and  uncle affected by heart disease, today developed chest pain while  at rest with radiation down the left arm and to the ribs. He had  no pleuritic component and had no cough, low-grade fever to 100.4  in the emergency room. No previous history of exertional chest  pain. No history of drug use. No history of alcohol use. No  history of trauma. No history of skin rash. He has a history of  anxiety and depression, for which he uses multiple  antidepressants and benzodiazepines. He has a history of panic  attacks, night terrors, and posttraumatic stress disorder.    PAST MEDICAL HISTORY: Significant for posttraumatic stress  disorder, back pain, deaf in the left ear, night terrors.    PAST SURGICAL HISTORY: Surgery on the knee.    SOCIAL HISTORY: The patient is a nonsmoker, nondrinker. Denies  drug use.    REVIEW OF SYSTEMS  CONSTITUTIONAL: Positive for fever today.  HEENT: Negative history of eye disease, sinus disease.  PULMONARY: Negative history of wheezing, asthma, pneumonia, DVT  or pulmonary emboli.  CARDIAC: See above.  ABDOMEN: Negative history of ulcer disease, gallbladder disease,  blood in the stool, black colored stools, hepatitis or jaundice.  GENITOURINARY: Negative history of stones, infections or blood in  the urine.  ENDOCRINE: Negative history of diabetes or thyroid disease.   NEUROPSYCHIATRIC: History of posttraumatic stress disorder,  depression.  MUSCULOSKELETAL: History of low back pain.    PHYSICAL EXAMINATION  VITAL SIGNS: Blood pressure 148/94, pulse 126, temperature 98.6,  respirations 20. Height is 6 feet, weight is 250 pounds. O2  saturation 98%.  SKIN: Warm, dry and intact.  HEENT: Pupils equal, round, react to light and accommodation.  Extraocular movements are full. Sclerae are anicteric.  Conjunctivae normal, pink and moist. Pharynx: Normal tongue.  Normal buccal mucosa.  NECK: Supple, without JVD.  LUNGS: Clear to percussion and auscultation.  HEART: S1, S2, without murmurs, rubs, or gallops.  ABDOMEN: Soft, nontender. Bowel sounds are present.  EXTREMITIES: Without edema.  Pulses symmetrical and intact.  NEUROLOGIC: Alert and oriented x3. Cranial nerves 2 through 12  within normal limits. Motor exam is 5/5 upper and lower  extremities.    LABORATORY DATA: Remarkable for an EKG that shows incomplete  right bundle branch block, normal ST-T waves, possible LVH by  voltage with tachycardia.    Chest x-ray, single portable film showed no infiltrates. The  patient stopped smoking approximately 3 months ago.    D-dimer was 218. CK is 431. Sodium 141, potassium 4.0, chloride  104, CO2 29, glucose 89, anion gap is 9, BUN 11, creatinine 1.2.  White count 7.9, hematocrit 40.9, platelet count 288,000, 67  polys, 24 lymphs, 7 monocytes, 1 eosinophil, 1 basophil. Troponin  is negative. Magnesium was 1.7. CK was negative.    PROBLEMS  1. Atypical chest pain with low-grade fever, raises the  possibility of viral pleurodynia. Will repeat chest x-ray, PA and  lateral, in the morning, look for an infiltrate. D-dimer is  negative, which is sensitive for pulmonary emboli.  2. Depression. Continue antidepressants.  3. Benzodiazepine use. Will reduce the dose to a more reasonable  level, 1 mg q.6h. p.r.n. agitation.  4. Deep venous thrombosis and gastrointestinal prophylaxis.  Heparin and  Protonix.          Mariel Craft MD    [id number]  D:  05/10/2013   00:11  T:  05/10/2013   00:39  Job #:  [id number]                wm    CS Doc#:  [id number]                                    Page 1 of 3

## 2013-05-10 NOTE — Progress Notes (Signed)
Patient was discharged before I could visit.

## 2013-05-10 NOTE — Other (Signed)
Bedside and Verbal shift change report given to Marcene Brawn, RN (oncoming nurse) by Laurette Schimke, RN (offgoing nurse). Report included the following information SBAR, MAR and Cardiac Rhythm NSR.

## 2013-05-10 NOTE — Progress Notes (Signed)
Discharge instructions given and verbalized understanding. Telemetry monitor and male adapter removed for discharge. Discharged to wife/family car.

## 2013-05-10 NOTE — Discharge Summary (Signed)
Dictated

## 2013-05-15 LAB — CULTURE, BLOOD
Culture result:: NO GROWTH
Culture result:: NO GROWTH

## 2013-09-17 NOTE — ED Provider Notes (Signed)
Patient is a 28 y.o. male presenting with vomiting. The history is provided by the patient.   Vomiting   This is a new problem. The current episode started 2 days ago. The problem has not changed since onset.Associated symptoms include a fever, abdominal pain and diarrhea. Pertinent negatives include no chills.    Patient is having vomiting x2 days.  Bilious vomiting.  Thinks there might have been some blood spots in it earlier today.  He is c/o pain to the left flank area only during vomiting.  No pain at this time.  He has diarrhea with approx 5 watery BMs daily.          Past Medical History   Diagnosis Date   ??? PTSD (post-traumatic stress disorder)    ??? Ill-defined condition      back problems/deaf to left ear        Past Surgical History   Procedure Laterality Date   ??? Hx orthopaedic           No family history on file.     History     Social History   ??? Marital Status: MARRIED     Spouse Name: N/A     Number of Children: N/A   ??? Years of Education: N/A     Occupational History   ??? Not on file.     Social History Main Topics   ??? Smoking status: Former Smoker     Quit date: 12/08/2012   ??? Smokeless tobacco: Not on file   ??? Alcohol Use: No   ??? Drug Use: No   ??? Sexual Activity: Not on file     Other Topics Concern   ??? Not on file     Social History Narrative   ??? No narrative on file                  ALLERGIES: Trazodone      Review of Systems   Constitutional: Positive for fever. Negative for chills and appetite change.   HENT: Negative for sore throat and trouble swallowing.    Respiratory: Negative.    Cardiovascular: Negative.    Gastrointestinal: Positive for nausea, vomiting, abdominal pain and diarrhea. Negative for constipation.   Genitourinary: Negative for dysuria, hematuria and testicular pain.   All other systems reviewed and are negative.      Filed Vitals:    09/17/13 2208   BP: 142/80   Pulse: 98   Temp: 98.4 ??F (36.9 ??C)   Resp: 18   Height: $Remove'6\' 2"'TzAXoFu$  (1.88 m)   Weight: 131.543 kg (290 lb)   SpO2: 98%             Physical Exam   Constitutional: He is oriented to person, place, and time. He appears well-developed and well-nourished.   HENT:   Head: Normocephalic and atraumatic.   Mouth/Throat: Oropharynx is clear and moist.   Eyes: Conjunctivae and EOM are normal. Pupils are equal, round, and reactive to light.   Neck: Normal range of motion. Neck supple.   Cardiovascular: Normal rate, regular rhythm and normal heart sounds.  Exam reveals no gallop and no friction rub.    No murmur heard.  Pulmonary/Chest: Effort normal and breath sounds normal. No respiratory distress. He has no wheezes. He has no rales.   Abdominal: Soft. Bowel sounds are normal. He exhibits no distension and no mass. There is no hepatosplenomegaly. There is no tenderness. There is no rigidity, no rebound, no guarding, no  CVA tenderness, no tenderness at McBurney's point and negative Murphy's sign.   Musculoskeletal: Normal range of motion. He exhibits no edema or tenderness.   Neurological: He is alert and oriented to person, place, and time.   Skin: Skin is warm and dry. No rash noted.   Psychiatric: He has a normal mood and affect. His behavior is normal.   Nursing note and vitals reviewed.       MDM    Procedures      Labs reviewed - results are all unremarkable.    Results d/w patient.      <EMERGENCY DEPARTMENT CASE SUMMARY>    Plan: Discharge home with Zofran ODT, PRN.  Patient counselled on PO hydration.    ED Course: as noted above    Final Impression/Diagnosis: Gastroenteritis    Patient condition at time of disposition: stable      I have reviewed the following home medications:    Prior to Admission medications    Medication Sig Start Date End Date Taking? Authorizing Provider   lorazepam (ATIVAN) 2 mg tablet Take 1 tablet by mouth every eight (8) hours as needed for Anxiety. 05/10/13   Rosalita Chessman, MD   prazosin (MINIPRESS) 5 mg capsule Take 1 capsule by mouth nightly. Indications: CHRONIC PTSD WITH TRAUMA NIGHTMARES 05/10/13   Rosalita Chessman, MD   sertraline (ZOLOFT) 100 mg tablet Take 1 tablet by mouth two (2) times a day. 05/10/13   Rosalita Chessman, MD   quetiapine (SEROQUEL) 100 mg tablet Take 1 tablet by mouth nightly as needed. 05/10/13   Rosalita Chessman, MD   ibuprofen (MOTRIN) 600 mg tablet Take 1 tablet by mouth every six (6) hours as needed for Pain (with food). 05/10/13   Rosalita Chessman, MD   omeprazole (PRILOSEC) 20 mg capsule Take 20 mg by mouth three (3) times daily.    Historical Provider         Edwena Blow, MD      The following are your lab and/or imaging results:  Recent Results (from the past 24 hour(s))   CBC WITH AUTOMATED DIFF    Collection Time     09/17/13 11:00 PM       Result Value Ref Range    WBC 7.0  4.8 - 11.0 K/uL    RBC 5.11  4.70 - 6.10 M/uL    HGB 15.3  13.5 - 17.5 g/dL    HCT 43.4  41.0 - 53.0 %    MCV 84.9  80.0 - 100.0 FL    MCH 29.9  27.0 - 31.0 PG    MCHC 35.3  31.0 - 37.0 g/dL    RDW 12.2  11.5 - 14.0 %    PLATELET 270  130 - 400 K/uL    MPV 10.3 (*) 6.5 - 9.5 FL    NEUTROPHILS 64  42.2 - 75.2 %    LYMPHOCYTES 22  20.5 - 51.1 %    MONOCYTES 6  0.0 - 12.0 %    EOSINOPHILS 7  0.0 - 7.0 %    BASOPHILS 1  0.0 - 2.5 %    ABS. NEUTROPHILS 4.6  2.0 - 8.1 K/UL    ABS. LYMPHOCYTES 1.5  1.3 - 3.4 K/UL    ABS. MONOCYTES 0.4  0.1 - 1.0 K/UL    ABS. EOSINOPHILS 0.5 (*) 0.0 - 0.2 K/UL    ABS. BASOPHILS 0.0  0.0 - 0.1 K/UL    DF AUTOMATED      IMMATURE GRANULOCYTES  0.0  0 - 2 %    ABS. IMM. GRANS. 0.0  0 K/UL   METABOLIC PANEL, COMPREHENSIVE    Collection Time     09/17/13 11:00 PM       Result Value Ref Range    Sodium 141  136 - 145 mmol/L    Potassium 4.9  3.5 - 5.1 mmol/L    Chloride 106  98 - 107 mmol/L    CO2 26  21 - 32 mmol/L    Anion gap 9  4 - 12 mmol/L    Glucose 106  74 - 106 mg/dL    BUN 10  7 - 18 mg/dL    Creatinine 1.1  0.8 - 1.3 mg/dL    GFR est AA >60  >60 ml/min/1.46m2    GFR est non-AA >60  >60 ml/min/1.35m2    Calcium 8.8  8.5 - 10.1 mg/dL    Bilirubin, total 0.2  0.2 - 1.0 mg/dL    ALT 40  12 - 78 U/L     AST 19  15 - 37 U/L    Alk. phosphatase 76  46 - 116 U/L    Protein, total 7.7  6.4 - 8.2 g/dL    Albumin 3.9  3.4 - 5.0 g/dL    Globulin 3.8  2.8 - 3.9 g/dL    A-G Ratio 1.0  1.0 - 1.5     LIPASE    Collection Time     09/17/13 11:00 PM       Result Value Ref Range    Lipase 164  73 - 393 U/L

## 2013-09-17 NOTE — ED Notes (Signed)
Reports left sided pain when vomiting

## 2013-09-17 NOTE — ED Notes (Signed)
C/o nausea and vomiting , s/s for a few days . Pts temp was 99.6 last pm ,pt reports frequent diarrhea , vomited 1 time today

## 2013-09-17 NOTE — ED Notes (Signed)
Patient has been discharged home as per MD Pius with instructions to take medications as directed. Monitor for abdominal pain, increase in nausea, vomiting,diarrhea or worsening of condition and return to ED. Patient verbalized understanding.

## 2013-09-18 LAB — CBC WITH AUTOMATED DIFF
ABS. BASOPHILS: 0 10*3/uL (ref 0.0–0.1)
ABS. EOSINOPHILS: 0.5 10*3/uL — ABNORMAL HIGH (ref 0.0–0.2)
ABS. IMM. GRANS.: 0 10*3/uL
ABS. LYMPHOCYTES: 1.5 10*3/uL (ref 1.3–3.4)
ABS. MONOCYTES: 0.4 10*3/uL (ref 0.1–1.0)
ABS. NEUTROPHILS: 4.6 10*3/uL (ref 2.0–8.1)
BASOPHILS: 1 % (ref 0.0–2.5)
EOSINOPHILS: 7 % (ref 0.0–7.0)
HCT: 43.4 % (ref 41.0–53.0)
HGB: 15.3 g/dL (ref 13.5–17.5)
IMMATURE GRANULOCYTES: 0 % (ref 0–2)
LYMPHOCYTES: 22 % (ref 20.5–51.1)
MCH: 29.9 PG (ref 27.0–31.0)
MCHC: 35.3 g/dL (ref 31.0–37.0)
MCV: 84.9 FL (ref 80.0–100.0)
MONOCYTES: 6 % (ref 0.0–12.0)
MPV: 10.3 FL — ABNORMAL HIGH (ref 6.5–9.5)
NEUTROPHILS: 64 % (ref 42.2–75.2)
PLATELET: 270 10*3/uL (ref 130–400)
RBC: 5.11 M/uL (ref 4.70–6.10)
RDW: 12.2 % (ref 11.5–14.0)
WBC: 7 10*3/uL (ref 4.8–11.0)

## 2013-09-18 LAB — METABOLIC PANEL, COMPREHENSIVE
A-G Ratio: 1 (ref 1.0–1.5)
ALT (SGPT): 40 U/L (ref 12–78)
AST (SGOT): 19 U/L (ref 15–37)
Albumin: 3.9 g/dL (ref 3.4–5.0)
Alk. phosphatase: 76 U/L (ref 46–116)
Anion gap: 9 mmol/L (ref 4–12)
BUN: 10 mg/dL (ref 7–18)
Bilirubin, total: 0.2 mg/dL (ref 0.2–1.0)
CO2: 26 mmol/L (ref 21–32)
Calcium: 8.8 mg/dL (ref 8.5–10.1)
Chloride: 106 mmol/L (ref 98–107)
Creatinine: 1.1 mg/dL (ref 0.8–1.3)
GFR est AA: >60 mL/min/{1.73_m2} (ref >60)
GFR est non-AA: >60 mL/min/{1.73_m2} (ref >60)
Globulin: 3.8 g/dL (ref 2.8–3.9)
Glucose: 106 mg/dL (ref 74–106)
Potassium: 4.9 mmol/L (ref 3.5–5.1)
Protein, total: 7.7 g/dL (ref 6.4–8.2)
Sodium: 141 mmol/L (ref 136–145)

## 2013-09-18 LAB — LIPASE: Lipase: 164 U/L (ref 73–393)

## 2013-09-18 MED ORDER — ONDANSETRON 8 MG TAB, RAPID DISSOLVE
8 mg | ORAL_TABLET | Freq: Three times a day (TID) | ORAL | Status: DC | PRN
Start: 2013-09-18 — End: 2014-09-30

## 2014-09-30 ENCOUNTER — Emergency Department: Admit: 2014-10-01 | Payer: Self-pay

## 2014-09-30 DIAGNOSIS — J209 Acute bronchitis, unspecified: Secondary | ICD-10-CM

## 2014-09-30 NOTE — ED Notes (Signed)
Pt has episodes of o2 sat decreasing to 93 %,then after cough o2 sat increasing to 99%

## 2014-09-30 NOTE — ED Notes (Signed)
Pt states sudden onset of shortness of breath this morning and pt states has lasted all day. Pt has SAT of 90-92 percent in triage.

## 2014-09-30 NOTE — ED Provider Notes (Signed)
HPI Comments: 28y/o male with SOB/chest tightness since this morning and denies fever, chills, cough, tachycardia, tachypnea, palpitation, diaphoresis, illicit drug use      Patient is a 29 y.o. male presenting with respiratory distress syndrome. The history is provided by the patient.   Respiratory Distress  The problem occurs rarely.Pertinent negatives include no fever, no headaches, no sore throat, no cough, no wheezing, no chest pain, no vomiting, no rash and no leg swelling.        Past Medical History:   Diagnosis Date   ??? PTSD (post-traumatic stress disorder)    ??? Ill-defined condition      back problems/deaf to left ear       Past Surgical History:   Procedure Laterality Date   ??? Hx orthopaedic           History reviewed. No pertinent family history.    History     Social History   ??? Marital Status: MARRIED     Spouse Name: N/A   ??? Number of Children: N/A   ??? Years of Education: N/A     Occupational History   ??? Not on file.     Social History Main Topics   ??? Smoking status: Former Smoker     Quit date: 12/08/2012   ??? Smokeless tobacco: Not on file   ??? Alcohol Use: No   ??? Drug Use: No   ??? Sexual Activity: Not on file     Other Topics Concern   ??? Not on file     Social History Narrative           ALLERGIES: Trazodone      Review of Systems   Constitutional: Negative for fever, chills, activity change and fatigue.   HENT: Negative for drooling, mouth sores and sore throat.    Eyes: Negative for discharge and redness.   Respiratory: Positive for chest tightness and shortness of breath. Negative for apnea, cough, wheezing and stridor.    Cardiovascular: Negative for chest pain, palpitations and leg swelling.   Gastrointestinal: Negative for nausea, vomiting and diarrhea.   Endocrine: Negative for polydipsia, polyphagia and polyuria.   Genitourinary: Negative for dysuria, urgency and frequency.   Musculoskeletal: Negative for gait problem and neck stiffness.    Skin: Negative for color change, pallor and rash.   Allergic/Immunologic: Negative for environmental allergies and food allergies.   Neurological: Negative for dizziness, weakness, light-headedness and headaches.   Psychiatric/Behavioral: Negative for agitation. The patient is not nervous/anxious.    All other systems reviewed and are negative.      Filed Vitals:    09/30/14 2138   BP: 148/87   Pulse: 100   Temp: 99.7 ??F (37.6 ??C)   Resp: 22   Height: 6' (1.829 m)   Weight: 113.399 kg (250 lb)   SpO2: 90%            Physical Exam   Constitutional: He is oriented to person, place, and time. He appears well-developed and well-nourished. No distress.   HENT:   Head: Normocephalic.   Right Ear: External ear normal.   Left Ear: External ear normal.   Nose: Nose normal.   Mouth/Throat: Oropharynx is clear and moist. No oropharyngeal exudate.   Eyes: Conjunctivae and EOM are normal. Pupils are equal, round, and reactive to light. Right eye exhibits no discharge. No scleral icterus.   Neck: Normal range of motion. Neck supple.   Cardiovascular: Normal rate, regular rhythm, normal heart sounds and intact  distal pulses.  Exam reveals no gallop.    No murmur heard.  Pulmonary/Chest: Effort normal. No stridor. No respiratory distress. He has wheezes (+diffuse wheezes). He has no rales. He exhibits no tenderness.   Abdominal: Soft. Bowel sounds are normal. He exhibits no distension and no mass. There is no tenderness. There is no rebound and no guarding.   Musculoskeletal: Normal range of motion. He exhibits no edema or tenderness.   Lymphadenopathy:     He has no cervical adenopathy.   Neurological: He is alert and oriented to person, place, and time. He has normal reflexes. No cranial nerve deficit.   Skin: No rash noted. He is not diaphoretic. No erythema. No pallor.   Psychiatric: He has a normal mood and affect.   Nursing note and vitals reviewed.     Results for orders placed or performed during the hospital encounter of  09/30/14   CBC WITH AUTOMATED DIFF   Result Value Ref Range    WBC 9.4 4.8 - 11.0 K/uL    RBC 4.97 4.70 - 6.10 M/uL    HGB 14.7 12.0 - 16.0 g/dL    HCT 16.1 09.6 - 04.5 %    MCV 85.9 80.0 - 100.0 FL    MCH 29.6 27.0 - 31.0 PG    MCHC 34.4 31.0 - 37.0 g/dL    RDW 40.9 81.1 - 91.4 %    PLATELET 249 130 - 400 K/uL    MPV 10.2 (H) 6.5 - 9.5 FL    NEUTROPHILS 70 42.2 - 75.2 %    LYMPHOCYTES 12 (L) 20.5 - 51.1 %    MONOCYTES 8 0.0 - 12.0 %    EOSINOPHILS 9 (H) 0.0 - 7.0 %    BASOPHILS 1 0.0 - 2.5 %    ABS. NEUTROPHILS 6.6 2.0 - 8.1 K/UL    ABS. LYMPHOCYTES 1.2 (L) 1.3 - 3.4 K/UL    ABS. MONOCYTES 0.8 0.1 - 1.0 K/UL    ABS. EOSINOPHILS 0.9 (H) 0.0 - 0.2 K/UL    ABS. BASOPHILS 0.1 0.0 - 0.1 K/UL    DF AUTOMATED      IMMATURE GRANULOCYTES 0.1 0 - 2 %    ABS. IMM. GRANS. 0.0 0 K/UL   METABOLIC PANEL, BASIC   Result Value Ref Range    Sodium 141 136 - 145 mmol/L    Potassium 4.0 3.5 - 5.1 mmol/L    Chloride 103 98 - 107 mmol/L    CO2 29 21 - 32 mmol/L    Anion gap 9 4 - 12 mmol/L    Glucose 123 (H) 74 - 106 mg/dL    BUN 10 7 - 18 mg/dL    Creatinine 7.82 9.56 - 1.30 mg/dL    GFR est AA >21 >30 QM/VHQ/4.69G2    GFR est non-AA >60 >60 ml/min/1.96m2    Calcium 8.7 8.5 - 10.1 mg/dL   TROPONIN I   Result Value Ref Range    Troponin-I, Qt. <0.04 0.00 - 0.09 NG/ML   D DIMER   Result Value Ref Range    D-dimer 115 0 - 400 NG/ML   MAGNESIUM   Result Value Ref Range    Magnesium 2.1 1.8 - 2.4 mg/dL   EKG, 12 LEAD, INITIAL   Result Value Ref Range    Ventricular Rate 94 BPM    Atrial Rate 94 BPM    P-R Interval 144 ms    QRS Duration 100 ms    Q-T Interval 348 ms  QTC Calculation (Bezet) 435 ms    Calculated R Axis 81 degrees    Calculated T Axis 42 degrees    Diagnosis       Normal sinus rhythm  Inferior infarct , age undetermined  Abnormal ECG  When compared with ECG of 10-May-2013 07:33,  Inferior infarct is now present  Non-specific change in ST segment in Inferior leads  Non-specific change in ST segment in Lateral leads   Nonspecific T wave abnormality, worse in Inferior leads         MDM    Procedures      <EMERGENCY DEPARTMENT CASE SUMMARY>    Impression/Differential Diagnosis: 28y/o male with SOB/chest tightness since this morning and denies fever, chills, cough, tachycardia, tachypnea, palpitation, diaphoresis, illicit drug use    Plan: CBC, CMP, UA, D Dimer, troponin, EKG, CXR, cardiac monitor, pulse oximetry for further evaluation. NEB    ED Course: CBC, CMP, UA, D Dimer, troponin, EKG results reviewed and discussed with pt in detail. Awaiting CXR result. Due to end of shift, All findings and labs discussed with Dr. Jimmie Molly who assumed care at this time    Final Impression/Diagnosis: SOB/chest tightness    Patient condition at time of disposition: Stable      I have reviewed the following home medications:    Prior to Admission medications    Medication Sig Start Date End Date Taking? Authorizing Provider   omeprazole (PRILOSEC) 20 mg capsule Take 20 mg by mouth three (3) times daily.   Yes Historical Provider         Vida Roller, PA

## 2014-09-30 NOTE — ED Notes (Signed)
[facility]

## 2014-09-30 NOTE — ED Notes (Signed)
2230  Pt ambulated from x-ray and o2 sat decreased to 91 % on ra  2235  After rest for 5 mins o2 sat increased to 94% on ra   2250  o2 sat is 97% on ra

## 2014-09-30 NOTE — ED Notes (Signed)
I have reviewed discharge instructions and prescription with the patient.  The patient verbalized understanding.  Pt ambulatory to exit with male visitor.

## 2014-09-30 NOTE — ED Notes (Signed)
I personally saw and examined the patient.  I have reviewed and agree with the MLP's findings, including all diagnostic interpretations, and plans as written.   I was present during the key portions of separately billed procedures.      29 yo male with no PMH, no history of asthma or respiratory problems.  Presented today with sudden onset SOB earlier today.  Noted with low O2Sat and wheezing at triage.    Patient improved after duo-nebs.  No more wheezing on repeat exam by me.  O2 Sat improved to 96% on room air.  Labs, including d-dimer are normal.  CXR reviewed by me and report noted - "streaky opacities" retrocardiac.    Presentation suggests acute bronchitis.  Given radiologist concern for possible early pneumonia, I will treat with ABx, as well as albuterol MDI and Prednisone.  Refer to primary care for follow up.    Richard SchullerMatthew Bethzaida Boord, MD

## 2014-09-30 NOTE — ED Notes (Signed)
Recd in Fast tract c/o sudden onset of SOB this afternoon while putting starter in car/ with symptoms nausea/headache/ denies any chest pains

## 2014-10-01 ENCOUNTER — Inpatient Hospital Stay
Admit: 2014-10-01 | Discharge: 2014-10-01 | Disposition: A | Discharge status: Home / Self Care | Payer: Self-pay | Attending: Emergency Medicine

## 2014-10-01 LAB — D-DIMER, QUANTITATIVE: D DIMER,DDMR: 115 NG/ML (ref 0–400)

## 2014-10-01 LAB — CBC WITH AUTOMATED DIFF
ABS. BASOPHILS: 0.1 10*3/uL (ref 0.0–0.1)
ABS. EOSINOPHILS: 0.9 10*3/uL — ABNORMAL HIGH (ref 0.0–0.2)
ABS. IMM. GRANS.: 0 10*3/uL
ABS. LYMPHOCYTES: 1.2 10*3/uL — ABNORMAL LOW (ref 1.3–3.4)
ABS. MONOCYTES: 0.8 10*3/uL (ref 0.1–1.0)
ABS. NEUTROPHILS: 6.6 10*3/uL (ref 2.0–8.1)
BASOPHILS: 1 % (ref 0.0–2.5)
EOSINOPHILS: 9 % — ABNORMAL HIGH (ref 0.0–7.0)
HCT: 42.7 % (ref 41.0–53.0)
HGB: 14.7 g/dL (ref 12.0–16.0)
IMMATURE GRANULOCYTES: 0.1 % (ref 0–2)
LYMPHOCYTES: 12 % — ABNORMAL LOW (ref 20.5–51.1)
MCH: 29.6 PG (ref 27.0–31.0)
MCHC: 34.4 g/dL (ref 31.0–37.0)
MCV: 85.9 FL (ref 80.0–100.0)
MONOCYTES: 8 % (ref 0.0–12.0)
MPV: 10.2 FL — ABNORMAL HIGH (ref 6.5–9.5)
NEUTROPHILS: 70 % (ref 42.2–75.2)
PLATELET: 249 10*3/uL (ref 130–400)
RBC: 4.97 M/uL (ref 4.70–6.10)
RDW: 12.3 % (ref 11.5–14.0)
WBC: 9.4 10*3/uL (ref 4.8–11.0)

## 2014-10-01 LAB — METABOLIC PANEL, BASIC
Anion gap: 9 mmol/L (ref 4–12)
BUN: 10 mg/dL (ref 7–18)
CO2: 29 mmol/L (ref 21–32)
Calcium: 8.7 mg/dL (ref 8.5–10.1)
Chloride: 103 mmol/L (ref 98–107)
Creatinine: 1.04 mg/dL (ref 0.70–1.30)
GFR est AA: >60 mL/min/{1.73_m2} (ref >60)
GFR est non-AA: >60 mL/min/{1.73_m2} (ref >60)
Glucose: 123 mg/dL — ABNORMAL HIGH (ref 74–106)
Potassium: 4 mmol/L (ref 3.5–5.1)
Sodium: 141 mmol/L (ref 136–145)

## 2014-10-01 LAB — EKG, 12 LEAD, INITIAL
Atrial Rate: 94 {beats}/min
Calculated R Axis: 81 degrees
Calculated T Axis: 42 degrees
Diagnosis: NORMAL
P-R Interval: 144 ms
Q-T Interval: 348 ms
QRS Duration: 100 ms
QTC Calculation (Bezet): 435 ms
Ventricular Rate: 94 {beats}/min

## 2014-10-01 LAB — D DIMER: D-dimer: 115 NG/ML (ref 0–400)

## 2014-10-01 LAB — MAGNESIUM: Magnesium: 2.1 mg/dL (ref 1.8–2.4)

## 2014-10-01 LAB — TROPONIN I: Troponin-I, Qt.: <0.04 NG/ML (ref 0.00–0.09)

## 2014-10-01 MED ORDER — IPRATROPIUM-ALBUTEROL 2.5 MG-0.5 MG/3 ML NEB SOLUTION
2.5 mg-0.5 mg/3 ml | RESPIRATORY_TRACT | Status: AC
Start: 2014-10-01 — End: 2014-09-30
  Administered 2014-10-01: 04:00:00 via RESPIRATORY_TRACT

## 2014-10-01 MED ORDER — PREDNISONE 20 MG TAB
20 mg | ORAL_TABLET | Freq: Every day | ORAL | Status: AC
Start: 2014-10-01 — End: 2014-10-05

## 2014-10-01 MED ORDER — IPRATROPIUM-ALBUTEROL 2.5 MG-0.5 MG/3 ML NEB SOLUTION
2.5 mg-0.5 mg/3 ml | RESPIRATORY_TRACT | Status: DC
Start: 2014-10-01 — End: 2014-10-01

## 2014-10-01 MED ORDER — IPRATROPIUM-ALBUTEROL 2.5 MG-0.5 MG/3 ML NEB SOLUTION
2.5 mg-0.5 mg/3 ml | RESPIRATORY_TRACT | Status: AC
Start: 2014-10-01 — End: 2014-09-30
  Administered 2014-10-01: 03:00:00 via RESPIRATORY_TRACT

## 2014-10-01 MED ORDER — DOXYCYCLINE HYCLATE 100 MG TAB
100 mg | ORAL_TABLET | Freq: Two times a day (BID) | ORAL | Status: AC
Start: 2014-10-01 — End: 2014-10-05

## 2014-10-01 MED ORDER — ALBUTEROL SULFATE HFA 90 MCG/ACTUATION AEROSOL INHALER
90 mcg/actuation | Freq: Four times a day (QID) | RESPIRATORY_TRACT | Status: AC | PRN
Start: 2014-10-01 — End: ?

## 2014-10-01 MED FILL — IPRATROPIUM-ALBUTEROL 2.5 MG-0.5 MG/3 ML NEB SOLUTION: 2.5 mg-0.5 mg/3 ml | RESPIRATORY_TRACT | Qty: 3

## 2016-04-05 ENCOUNTER — Encounter (HOSPITAL_COMMUNITY): Payer: Self-pay | Admitting: Emergency Medicine

## 2016-04-05 ENCOUNTER — Emergency Department (HOSPITAL_COMMUNITY)
Admission: EM | Admit: 2016-04-05 | Discharge: 2016-04-05 | Disposition: A | Discharge status: Home / Self Care | Payer: Non-veteran care | Attending: Emergency Medicine | Admitting: Emergency Medicine

## 2016-04-05 DIAGNOSIS — Y999 Unspecified external cause status: Secondary | ICD-10-CM | POA: Diagnosis not present

## 2016-04-05 DIAGNOSIS — Y9234 Swimming pool (public) as the place of occurrence of the external cause: Secondary | ICD-10-CM | POA: Diagnosis not present

## 2016-04-05 DIAGNOSIS — Z87891 Personal history of nicotine dependence: Secondary | ICD-10-CM | POA: Insufficient documentation

## 2016-04-05 DIAGNOSIS — W16512A Jumping or diving into swimming pool striking water surface causing other injury, initial encounter: Secondary | ICD-10-CM | POA: Insufficient documentation

## 2016-04-05 DIAGNOSIS — IMO0002 Reserved for concepts with insufficient information to code with codable children: Secondary | ICD-10-CM

## 2016-04-05 DIAGNOSIS — Y9339 Activity, other involving climbing, rappelling and jumping off: Secondary | ICD-10-CM | POA: Insufficient documentation

## 2016-04-05 DIAGNOSIS — S91112A Laceration without foreign body of left great toe without damage to nail, initial encounter: Secondary | ICD-10-CM | POA: Insufficient documentation

## 2016-04-05 NOTE — ED Provider Notes (Signed)
[facility]-EMERGENCY DEPT Provider Note   CSN: [id number] Arrival date & time: 04/05/16  1354  By signing my name below, I, Javier Dockerobert Ryan Halas, attest that this documentation has been prepared under the direction and in the presence of Newell RubbermaidJeffrey Moe Graca, PA-C. Electronically Signed: Javier Dockerobert Ryan Halas, ER Scribe. 03/21/2016. 2:58 PM.   History   Chief Complaint Chief Complaint  Patient presents with  . Toe Pain    The history is provided by the patient. No language interpreter was used.    HPI Comments: Thomas SpillersJason C Hess is a 30 y.o. male who presents to the Emergency Department complaining of pain after lacerating his left great toe while jumping into a pool earlier today. His tetanus is UTD. Patient reports that the flap of skin was open, he placed it back in place. He reports that this was very superficial.   Past Medical History:  Diagnosis Date  . PTSD (post-traumatic stress disorder)   . PTSD (post-traumatic stress disorder)   . PTSD (post-traumatic stress disorder)     Patient Active Problem List   Diagnosis Date Noted  . Knee pain 02/23/2011    Past Surgical History:  Procedure Laterality Date  . KNEE SURGERY       Home Medications    Prior to Admission medications   Medication Sig Start Date End Date Taking? Authorizing Provider  sertraline (ZOLOFT) 50 MG tablet Take 50 mg by mouth daily.    Historical Provider, MD    Family History No family history on file.  Social History Social History  Substance Use Topics  . Smoking status: Former Games developermoker  . Smokeless tobacco: Former NeurosurgeonUser  . Alcohol use Yes     Comment: occ     Allergies   Shellfish allergy and Vicodin [hydrocodone-acetaminophen]   Review of Systems Review of Systems  Constitutional: Negative for chills and fever.  Skin: Positive for color change and wound.  Neurological: Negative for weakness and numbness.     Physical Exam Updated Vital Signs BP 124/69 (BP Location: Left Arm)   Pulse 93    Temp 99.6 F (37.6 C)   Resp 16   Ht 6' (1.829 m)   Wt 102.1 kg   SpO2 100%   BMI 30.52 kg/m   Physical Exam  Constitutional: He is oriented to person, place, and time. He appears well-developed and well-nourished. No distress.  HENT:  Head: Normocephalic and atraumatic.  Eyes: Pupils are equal, round, and reactive to light.  Neck: Neck supple.  Cardiovascular: Normal rate.   Pulmonary/Chest: Effort normal. No respiratory distress.  Musculoskeletal: Normal range of motion.  L shaped laceration to the left great toe, skin flat lined up with good approximation, no underlying tenderness to palpation. No active bleeding.  Neurological: He is alert and oriented to person, place, and time. Coordination normal.  Skin: Skin is warm and dry. He is not diaphoretic.  Psychiatric: He has a normal mood and affect. His behavior is normal.  Nursing note and vitals reviewed.    ED Treatments / Results  Labs (all labs ordered are listed, but only abnormal results are displayed) Labs Reviewed - No data to display  EKG  EKG Interpretation None       Radiology No results found.  Procedures Procedures (including critical care time)  Medications Ordered in ED Medications - No data to display   Initial Impression / Assessment and Plan / ED Course  I have reviewed the triage vital signs and the nursing notes.  Pertinent labs &  imaging results that were available during my care of the patient were reviewed by me and considered in my medical decision making (see chart for details).  Clinical Course      Final Clinical Impressions(s) / ED Diagnoses   Final diagnoses:  Laceration   Labs:  Imaging:  Consults:  Therapeutics:  Discharge Meds:   Assessment/Plan:30 year old male presents today with skin tear. Patient is adamant that he does not want any injections or sutures unless completely necessary. Patient had a skin tear which she's replaced with good anatomical  alignment, no significant open wound. I felt it was reasonable for patient to go home soak in warm tub, clean out wound, The skin flap back over the toe, apply Steri-Strips and monitor for signs of infection. I was unable to remove the flap. Today he has heat and he had wound healing and approximation. Patient was happy with today's plan, and strict return precautions given, patient verbalized understanding and agreement to today's plan had no further questions concerns      New Prescriptions Discharge Medication List as of 04/05/2016  3:11 PM     I personally performed the services described in this documentation, which was scribed in my presence. The recorded information has been reviewed and is accurate.        Eyvonne Mechanic, PA-C 04/05/16 1530    Marily Memos, MD 04/06/16 [phone]

## 2016-04-05 NOTE — ED Triage Notes (Signed)
Pt. Stated, I was getting ready to jump in pool and my toe dragged on the gravel. Left big toe

## 2016-04-05 NOTE — Discharge Instructions (Signed)
Please keep wound clean, dry, covered. Please monitor for signs of infection and return immediately if any present.

## 2016-07-12 ENCOUNTER — Emergency Department (HOSPITAL_COMMUNITY)
Admission: EM | Admit: 2016-07-12 | Discharge: 2016-07-12 | Disposition: A | Discharge status: Home / Self Care | Payer: Non-veteran care | Attending: Emergency Medicine | Admitting: Emergency Medicine

## 2016-07-12 ENCOUNTER — Encounter (HOSPITAL_COMMUNITY): Payer: Self-pay | Admitting: Emergency Medicine

## 2016-07-12 DIAGNOSIS — Z87891 Personal history of nicotine dependence: Secondary | ICD-10-CM | POA: Insufficient documentation

## 2016-07-12 DIAGNOSIS — Y929 Unspecified place or not applicable: Secondary | ICD-10-CM | POA: Insufficient documentation

## 2016-07-12 DIAGNOSIS — Y999 Unspecified external cause status: Secondary | ICD-10-CM | POA: Insufficient documentation

## 2016-07-12 DIAGNOSIS — S61210A Laceration without foreign body of right index finger without damage to nail, initial encounter: Secondary | ICD-10-CM

## 2016-07-12 DIAGNOSIS — Y939 Activity, unspecified: Secondary | ICD-10-CM | POA: Insufficient documentation

## 2016-07-12 DIAGNOSIS — W312XXA Contact with powered woodworking and forming machines, initial encounter: Secondary | ICD-10-CM | POA: Insufficient documentation

## 2016-07-12 MED ORDER — LIDOCAINE HCL 2 % IJ SOLN
20.0000 mL | Freq: Once | INTRAMUSCULAR | Status: DC
  Filled 2016-07-12: qty 20

## 2016-07-12 MED ORDER — IBUPROFEN 200 MG PO TABS
600.0000 mg | ORAL_TABLET | Freq: Once | ORAL | Status: AC
  Administered 2016-07-12: 600 mg via ORAL
  Filled 2016-07-12: qty 3

## 2016-07-12 MED ORDER — CEPHALEXIN 500 MG PO CAPS: 500.0000 mg | ORAL_CAPSULE | Freq: Four times a day (QID) | ORAL | 0 refills | Status: AC

## 2016-07-12 MED ORDER — CEPHALEXIN 500 MG PO CAPS
500.0000 mg | ORAL_CAPSULE | Freq: Once | ORAL | Status: AC
  Administered 2016-07-12: 500 mg via ORAL
  Filled 2016-07-12: qty 1

## 2016-07-12 NOTE — Discharge Instructions (Signed)
Please read and follow all provided instructions.  Your diagnoses today include:  1. Laceration of right index finger, foreign body presence unspecified, nail damage status unspecified, initial encounter    Tests performed today include:  Vital signs. See below for your results today.   Medications prescribed:   Keflex (cephalexin) - antibiotic  You have been prescribed an antibiotic medicine: take the entire course of medicine even if you are feeling better. Stopping early can cause the antibiotic not to work.  Take any prescribed medications only as directed.   Home care instructions:  Follow any educational materials and wound care instructions contained in this packet.   Keep affected area above the level of your heart when possible to minimize swelling. Wash area gently twice a day with warm soapy water. Do not apply alcohol or hydrogen peroxide. Cover the area if it draining or weeping.   Follow-up instructions: Suture Removal: Return to the Emergency Department or see your primary care care doctor in 10 days for a recheck of your wound and removal of your sutures or staples.    Return instructions:  Return to the Emergency Department if you have:  Fever  Worsening pain  Worsening swelling of the wound  Pus draining from the wound  Redness of the skin that moves away from the wound, especially if it streaks away from the affected area   Any other emergent concerns  Your vital signs today were: BP 134/72 (BP Location: Left Arm)    Pulse 99    Temp 98.2 F (36.8 C) (Oral)    Resp 17    SpO2 99%  If your blood pressure (BP) was elevated above 135/85 this visit, please have this repeated by your doctor within one month. --------------

## 2016-07-12 NOTE — ED Triage Notes (Signed)
Per EMS patient comes from home for laceration in between index and middle finger with grinder saw. EMs has hand wrapped at this time and bleeding is controlled.

## 2016-07-12 NOTE — ED Provider Notes (Signed)
[facility]-EMERGENCY DEPT Provider Note   CSN: [id number] Arrival date & time: 07/12/16  1715   By signing my name below, I, Nelwyn Salisbury, attest that this documentation has been prepared under the direction and in the presence of non-physician practitioner, Rhea Bleacher PA-C. Electronically Signed: Nelwyn Salisbury, Scribe. 07/12/2016. 5:28 PM.  History   Chief Complaint Chief Complaint  Patient presents with  . Extremity Laceration   The history is provided by the patient. No language interpreter was used.   HPI Comments:  Thomas Hess is an otherwise healthy 30 y.o. male who presents to the Emergency Department by EMS complaining of sudden-onset constant right hand wound occurring earlier today. Pt states that he was cutting his shed with a grinder saw when he slipped and caught his hand between his index and middle finger. He denies any numbness or focal weakness to the area. Pt is UTD on tetanus.   Past Medical History:  Diagnosis Date  . PTSD (post-traumatic stress disorder)   . PTSD (post-traumatic stress disorder)   . PTSD (post-traumatic stress disorder)     Patient Active Problem List   Diagnosis Date Noted  . Knee pain 02/23/2011    Past Surgical History:  Procedure Laterality Date  . KNEE SURGERY         Home Medications    Prior to Admission medications   Medication Sig Start Date End Date Taking? Authorizing Provider  sertraline (ZOLOFT) 50 MG tablet Take 50 mg by mouth daily.    Historical Provider, MD    Family History No family history on file.  Social History Social History  Substance Use Topics  . Smoking status: Former Games developer  . Smokeless tobacco: Former Neurosurgeon  . Alcohol use Yes     Comment: occ     Allergies   Shellfish allergy and Vicodin [hydrocodone-acetaminophen]   Review of Systems Review of Systems  Constitutional: Negative for activity change.  Musculoskeletal: Positive for myalgias. Negative for arthralgias, back pain, gait  problem, joint swelling and neck pain.  Skin: Positive for wound.  Neurological: Negative for weakness and numbness.     Physical Exam Updated Vital Signs BP 134/72 (BP Location: Left Arm)   Pulse 99   Temp 98.2 F (36.8 C) (Oral)   Resp 17   SpO2 99%   Physical Exam  Constitutional: He appears well-developed and well-nourished. No distress.  HENT:  Head: Normocephalic and atraumatic.  Eyes: Conjunctivae are normal.  Neck: Normal range of motion. Neck supple.  Cardiovascular: Normal rate and normal pulses.  Exam reveals no decreased pulses.   Pulmonary/Chest: Effort normal.  Abdominal: He exhibits no distension.  Musculoskeletal: He exhibits tenderness. He exhibits no edema.  Patient with 4 cm laceration over the right hand located on the ulnar side of the right index finger onto the hand. Wound is fully explored and appears clean. No exposed tendon. No foreign body seen. Wound is hemostatic. Patient with 5/5 strength of R index finger in flexion and extension at all joints. No distal numbness or tingling.   Neurological: He is alert. No sensory deficit.  Motor, sensation, and vascular distal to the injury is fully intact.   Skin: Skin is warm and dry.  Psychiatric: He has a normal mood and affect.  Nursing note and vitals reviewed.    ED Treatments / Results  DIAGNOSTIC STUDIES:  Oxygen Saturation is 99% on RA, normal by my interpretation.    COORDINATION OF CARE:  5:45 PM Discussed treatment plan with pt  at bedside which includes suture and pt agreed to plan.  Procedures .Marland Kitchen.Laceration Repair Date/Time: 07/12/2016 8:00 PM Performed by: Renne CriglerGEIPLE, Rodrigues Urbanek Authorized by: Renne CriglerGEIPLE, Chene Kasinger   Consent:    Consent obtained:  Verbal   Consent given by:  Patient   Risks discussed:  Infection, retained foreign body, tendon damage, vascular damage and nerve damage   Alternatives discussed:  No treatment Anesthesia (see MAR for exact dosages):    Anesthesia method:  Local  infiltration   Local anesthetic:  Lidocaine 2% w/o epi Laceration details:    Location:  Finger   Finger location:  R index finger   Length (cm):  4 Repair type:    Repair type:  Complex Pre-procedure details:    Preparation:  Patient was prepped and draped in usual sterile fashion Exploration:    Hemostasis achieved with:  Direct pressure   Wound exploration: wound explored through full range of motion and entire depth of wound probed and visualized     Wound extent: areolar tissue violated     Wound extent: no foreign bodies/material noted, no nerve damage noted, no tendon damage noted, no underlying fracture noted and no vascular damage noted     Contaminated: no   Treatment:    Area cleansed with:  Saline   Amount of cleaning:  Extensive   Irrigation solution:  Sterile saline   Irrigation volume:  500   Irrigation method:  Pressure wash   Debridement:  Minimal   Undermining:  None Skin repair:    Repair method:  Sutures   Suture size:  5-0   Suture material:  Nylon   Suture technique:  Simple interrupted   Number of sutures:  10 Approximation:    Approximation:  Close Post-procedure details:    Dressing:  Non-adherent dressing   Patient tolerance of procedure:  Tolerated well, no immediate complications   (including critical care time)  Medications Ordered in ED Medications  lidocaine (XYLOCAINE) 2 % (with pres) injection 400 mg (not administered)  cephALEXin (KEFLEX) capsule 500 mg (500 mg Oral Given 07/12/16 1919)  ibuprofen (ADVIL,MOTRIN) tablet 600 mg (600 mg Oral Given 07/12/16 1919)     Initial Impression / Assessment and Plan / ED Course  I have reviewed the triage vital signs and the nursing notes.  Pertinent labs & imaging results that were available during my care of the patient were reviewed by me and considered in my medical decision making (see chart for details).  Clinical Course    Patient seen and examined. Agrees to proceed with cleaning of  wound and repair. Repair as above.   Vital signs reviewed and are as follows: BP 115/73 (BP Location: Left Arm)   Pulse 85   Temp 98.2 F (36.8 C) (Oral)   Resp 16   SpO2 98%   Patient counseled on wound care. Patient counseled on need to return or see PCP/urgent care for suture removal in 10 days. Patient was urged to return to the Emergency Department urgently with worsening pain, swelling, expanding erythema especially if it streaks away from the affected area, fever, or if they have any other concerns. Patient verbalized understanding.   Abx given. Bandage placed and splint placed.     Final Clinical Impressions(s) / ED Diagnoses   Final diagnoses:  Laceration of right index finger, foreign body presence unspecified, nail damage status unspecified, initial encounter   Patient with finger lac, do not suspect any deep structures violated based on visual inspection. Good function and neuro prior  to repair.   New Prescriptions Discharge Medication List as of 07/12/2016  7:16 PM    START taking these medications   Details  cephALEXin (KEFLEX) 500 MG capsule Take 1 capsule (500 mg total) by mouth 4 (four) times daily., Starting Sun 07/12/2016, Print      I personally performed the services described in this documentation, which was scribed in my presence. The recorded information has been reviewed and is accurate.     Renne CriglerJoshua Marckus Hanover, PA-C 07/12/16 2005    Tilden FossaElizabeth Rees, MD 07/13/16 [phone]

## 2016-07-20 ENCOUNTER — Emergency Department (HOSPITAL_COMMUNITY)
Admission: EM | Admit: 2016-07-20 | Discharge: 2016-07-21 | Disposition: A | Discharge status: Home / Self Care | Payer: Non-veteran care | Attending: Emergency Medicine | Admitting: Emergency Medicine

## 2016-07-20 ENCOUNTER — Encounter (HOSPITAL_COMMUNITY): Payer: Self-pay

## 2016-07-20 DIAGNOSIS — Z87891 Personal history of nicotine dependence: Secondary | ICD-10-CM | POA: Insufficient documentation

## 2016-07-20 DIAGNOSIS — Z4802 Encounter for removal of sutures: Secondary | ICD-10-CM | POA: Insufficient documentation

## 2016-07-20 NOTE — ED Triage Notes (Signed)
Pt [facility] right index [facility] and here for suture removal. Pt has no complaints. Site is clean and no signs of infection.

## 2016-07-21 NOTE — ED Notes (Signed)
Patient verbalized understanding of discharge instructions and denies any further needs or questions at this time. VS stable. Patient ambulatory with steady gait.  

## 2016-07-21 NOTE — ED Provider Notes (Signed)
  [facility]-EMERGENCY DEPT Provider Note   CSN: [id number] Arrival date & time: 07/20/16  2335     History   Chief Complaint Chief Complaint  Patient presents with  . Suture / Staple Removal    HPI Thomas Hess is a 30 y.o. male here for removal of suture to the right index finger. Patient reports the sutures have been in x 10 days. Denies any problems.   HPI  Past Medical History:  Diagnosis Date  . PTSD (post-traumatic stress disorder)   . PTSD (post-traumatic stress disorder)   . PTSD (post-traumatic stress disorder)     Patient Active Problem List   Diagnosis Date Noted  . Knee pain 02/23/2011    Past Surgical History:  Procedure Laterality Date  . KNEE SURGERY         Home Medications    Prior to Admission medications   Medication Sig Start Date End Date Taking? Authorizing Provider  cephALEXin (KEFLEX) 500 MG capsule Take 1 capsule (500 mg total) by mouth 4 (four) times daily. 07/12/16   Renne CriglerJoshua Geiple, PA-C  sertraline (ZOLOFT) 50 MG tablet Take 50 mg by mouth daily.    Historical Provider, MD    Family History History reviewed. No pertinent family history.  Social History Social History  Substance Use Topics  . Smoking status: Former Games developermoker  . Smokeless tobacco: Former NeurosurgeonUser  . Alcohol use Yes     Comment: occ     Allergies   Shellfish allergy and Vicodin [hydrocodone-acetaminophen]   Review of Systems Review of Systems Negative except as stated in HPI  Physical Exam Updated Vital Signs BP 130/87 (BP Location: Right Arm)   Pulse 99   Temp 98 F (36.7 C) (Oral)   Resp 18   Ht 6' (1.829 m)   Wt 106.6 kg   SpO2 100%   BMI 31.87 kg/m   Physical Exam  Constitutional: He is oriented to person, place, and time. He appears well-developed and well-nourished. No distress.  Eyes: EOM are normal.  Neck: Neck supple.  Pulmonary/Chest: Effort normal.  Abdominal: Soft. There is no tenderness.  Musculoskeletal: Normal range of motion.    Sutures in place right index finger, dorsal aspect. No erythema or red streaking. Healing wound without signs of infection.  Neurological: He is alert and oriented to person, place, and time. No cranial nerve deficit.  Skin: Skin is warm and dry.  Psychiatric: He has a normal mood and affect. His behavior is normal.  Nursing note and vitals reviewed.    ED Treatments / Results  Labs (all labs ordered are listed, but only abnormal results are displayed) Labs Reviewed - No data to display   Radiology No results found.  Procedures Procedures (including critical care time) Suture removal by RN  Medications Ordered in ED Medications - No data to display   Initial Impression / Assessment and Plan / ED Course  I have reviewed the triage vital signs and the nursing notes.   Clinical Course    30 y.o. male here for suture removal without other problems. Will d/c and he will f/u as needed. Final Clinical Impressions(s) / ED Diagnoses   Final diagnoses:  Encounter for removal of sutures    New Prescriptions New Prescriptions   No medications on file     [facility] M Quincie Haroon, NP 07/21/16 [address]    Jonquil Stubbe M Wendle Kina, NP 07/21/16 [id number]    Zadie Rhineonald Wickline, MD 07/23/16 [phone]
